# Patient Record
Sex: Female | Born: 1950 | Race: White | Hispanic: No | Smoking: Former smoker
Health system: Southern US, Community
[De-identification: ages and names within clinical notes are randomized; demographics above are authoritative.]

## PROBLEM LIST (undated history)

## (undated) DIAGNOSIS — M8580 Other specified disorders of bone density and structure, unspecified site: Secondary | ICD-10-CM

## (undated) DIAGNOSIS — F411 Generalized anxiety disorder: Secondary | ICD-10-CM

## (undated) DIAGNOSIS — K589 Irritable bowel syndrome without diarrhea: Secondary | ICD-10-CM

## (undated) DIAGNOSIS — K5790 Diverticulosis of intestine, part unspecified, without perforation or abscess without bleeding: Secondary | ICD-10-CM

## (undated) DIAGNOSIS — F32A Depression, unspecified: Secondary | ICD-10-CM

## (undated) DIAGNOSIS — F329 Major depressive disorder, single episode, unspecified: Secondary | ICD-10-CM

## (undated) DIAGNOSIS — M51369 Other intervertebral disc degeneration, lumbar region without mention of lumbar back pain or lower extremity pain: Secondary | ICD-10-CM

## (undated) DIAGNOSIS — I700 Atherosclerosis of aorta: Secondary | ICD-10-CM

## (undated) DIAGNOSIS — E559 Vitamin D deficiency, unspecified: Secondary | ICD-10-CM

## (undated) DIAGNOSIS — M5136 Other intervertebral disc degeneration, lumbar region: Secondary | ICD-10-CM

## (undated) DIAGNOSIS — E669 Obesity, unspecified: Secondary | ICD-10-CM

## (undated) DIAGNOSIS — M1712 Unilateral primary osteoarthritis, left knee: Secondary | ICD-10-CM

## (undated) DIAGNOSIS — M1990 Unspecified osteoarthritis, unspecified site: Secondary | ICD-10-CM

## (undated) DIAGNOSIS — N809 Endometriosis, unspecified: Secondary | ICD-10-CM

## (undated) DIAGNOSIS — M48061 Spinal stenosis, lumbar region without neurogenic claudication: Secondary | ICD-10-CM

## (undated) DIAGNOSIS — K219 Gastro-esophageal reflux disease without esophagitis: Secondary | ICD-10-CM

## (undated) HISTORY — DX: Gastro-esophageal reflux disease without esophagitis: K21.9

## (undated) HISTORY — DX: Other intervertebral disc degeneration, lumbar region without mention of lumbar back pain or lower extremity pain: M51.369

## (undated) HISTORY — DX: Irritable bowel syndrome, unspecified: K58.9

## (undated) HISTORY — PX: REDUCTION MAMMAPLASTY: SUR839

## (undated) HISTORY — DX: Other intervertebral disc degeneration, lumbar region: M51.36

## (undated) HISTORY — PX: BREAST REDUCTION SURGERY: SHX8

## (undated) HISTORY — PX: BUNIONECTOMY: SHX129

## (undated) HISTORY — PX: KNEE ARTHROSCOPY: SHX127

## (undated) HISTORY — DX: Endometriosis, unspecified: N80.9

## (undated) HISTORY — DX: Depression, unspecified: F32.A

## (undated) HISTORY — PX: ABDOMINAL HYSTERECTOMY: SHX81

## (undated) HISTORY — DX: Major depressive disorder, single episode, unspecified: F32.9

---
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
## 1978-09-14 HISTORY — PX: ABDOMINAL HYSTERECTOMY: SHX81

## 1987-09-15 HISTORY — PX: REDUCTION MAMMAPLASTY: SUR839

## 2010-07-13 ENCOUNTER — Emergency Department (HOSPITAL_COMMUNITY): Admission: EM | Admit: 2010-07-13 | Discharge: 2010-07-14 | Payer: Self-pay | Admitting: Emergency Medicine

## 2010-07-14 ENCOUNTER — Ambulatory Visit: Payer: Self-pay | Admitting: Psychiatry

## 2010-07-14 ENCOUNTER — Inpatient Hospital Stay (HOSPITAL_COMMUNITY): Admission: AD | Admit: 2010-07-14 | Discharge: 2010-07-18 | Payer: Self-pay | Admitting: Psychiatry

## 2010-07-22 ENCOUNTER — Other Ambulatory Visit (HOSPITAL_COMMUNITY)
Admission: RE | Admit: 2010-07-22 | Discharge: 2010-08-13 | Payer: Self-pay | Source: Home / Self Care | Admitting: Psychiatry

## 2010-08-15 ENCOUNTER — Ambulatory Visit: Payer: Self-pay | Admitting: Psychiatry

## 2010-09-02 ENCOUNTER — Ambulatory Visit (HOSPITAL_COMMUNITY): Payer: Self-pay | Admitting: Psychiatry

## 2010-09-09 ENCOUNTER — Encounter
Admission: RE | Admit: 2010-09-09 | Discharge: 2010-09-09 | Payer: Self-pay | Source: Home / Self Care | Attending: Family Medicine | Admitting: Family Medicine

## 2010-09-24 ENCOUNTER — Ambulatory Visit (HOSPITAL_COMMUNITY)
Admission: RE | Admit: 2010-09-24 | Discharge: 2010-09-24 | Payer: Self-pay | Source: Home / Self Care | Attending: Psychiatry | Admitting: Psychiatry

## 2010-10-02 ENCOUNTER — Ambulatory Visit (HOSPITAL_COMMUNITY)
Admission: RE | Admit: 2010-10-02 | Discharge: 2010-10-02 | Payer: Self-pay | Source: Home / Self Care | Attending: Psychology | Admitting: Psychology

## 2010-10-16 ENCOUNTER — Ambulatory Visit (HOSPITAL_COMMUNITY): Admit: 2010-10-16 | Payer: Self-pay | Admitting: Psychology

## 2010-10-16 ENCOUNTER — Encounter (HOSPITAL_COMMUNITY): Payer: Self-pay | Admitting: Psychology

## 2010-10-17 ENCOUNTER — Encounter (HOSPITAL_COMMUNITY): Payer: BC Managed Care – PPO | Admitting: Psychiatry

## 2010-10-17 ENCOUNTER — Ambulatory Visit (HOSPITAL_COMMUNITY): Admit: 2010-10-17 | Payer: Self-pay | Admitting: Psychiatry

## 2010-10-17 DIAGNOSIS — F331 Major depressive disorder, recurrent, moderate: Secondary | ICD-10-CM

## 2010-10-20 ENCOUNTER — Encounter (HOSPITAL_COMMUNITY): Payer: BC Managed Care – PPO | Admitting: Psychology

## 2010-10-20 DIAGNOSIS — F331 Major depressive disorder, recurrent, moderate: Secondary | ICD-10-CM

## 2010-10-20 DIAGNOSIS — F411 Generalized anxiety disorder: Secondary | ICD-10-CM

## 2010-11-06 ENCOUNTER — Encounter (HOSPITAL_COMMUNITY): Payer: BC Managed Care – PPO | Admitting: Psychology

## 2010-11-06 ENCOUNTER — Ambulatory Visit (HOSPITAL_COMMUNITY)
Admission: RE | Admit: 2010-11-06 | Discharge: 2010-11-06 | Disposition: A | Discharge status: Home / Self Care | Payer: BC Managed Care – PPO | Source: Ambulatory Visit | Attending: Psychiatry | Admitting: Psychiatry

## 2010-11-06 DIAGNOSIS — F331 Major depressive disorder, recurrent, moderate: Secondary | ICD-10-CM

## 2010-11-06 DIAGNOSIS — F332 Major depressive disorder, recurrent severe without psychotic features: Secondary | ICD-10-CM | POA: Insufficient documentation

## 2010-11-06 DIAGNOSIS — F411 Generalized anxiety disorder: Secondary | ICD-10-CM

## 2010-11-07 ENCOUNTER — Other Ambulatory Visit (HOSPITAL_COMMUNITY): Payer: BC Managed Care – PPO | Attending: Psychiatry | Admitting: Psychiatry

## 2010-11-07 DIAGNOSIS — F329 Major depressive disorder, single episode, unspecified: Secondary | ICD-10-CM | POA: Insufficient documentation

## 2010-11-07 DIAGNOSIS — F3289 Other specified depressive episodes: Secondary | ICD-10-CM | POA: Insufficient documentation

## 2010-11-07 DIAGNOSIS — F411 Generalized anxiety disorder: Secondary | ICD-10-CM | POA: Insufficient documentation

## 2010-11-07 DIAGNOSIS — F331 Major depressive disorder, recurrent, moderate: Secondary | ICD-10-CM

## 2010-11-07 DIAGNOSIS — M1990 Unspecified osteoarthritis, unspecified site: Secondary | ICD-10-CM | POA: Insufficient documentation

## 2010-11-10 ENCOUNTER — Other Ambulatory Visit (HOSPITAL_COMMUNITY): Payer: BC Managed Care – PPO | Admitting: Psychiatry

## 2010-11-11 ENCOUNTER — Other Ambulatory Visit (HOSPITAL_COMMUNITY): Payer: BC Managed Care – PPO | Admitting: Psychiatry

## 2010-11-12 ENCOUNTER — Other Ambulatory Visit (HOSPITAL_COMMUNITY): Payer: BC Managed Care – PPO | Admitting: Psychiatry

## 2010-11-13 ENCOUNTER — Other Ambulatory Visit (HOSPITAL_COMMUNITY): Payer: BC Managed Care – PPO | Attending: Psychiatry | Admitting: Psychiatry

## 2010-11-14 ENCOUNTER — Other Ambulatory Visit (HOSPITAL_COMMUNITY): Payer: BC Managed Care – PPO | Admitting: Psychiatry

## 2010-11-17 ENCOUNTER — Other Ambulatory Visit (HOSPITAL_COMMUNITY): Payer: BC Managed Care – PPO | Admitting: Psychiatry

## 2010-11-18 ENCOUNTER — Other Ambulatory Visit (HOSPITAL_COMMUNITY): Payer: BC Managed Care – PPO | Admitting: Psychiatry

## 2010-11-19 ENCOUNTER — Other Ambulatory Visit (HOSPITAL_COMMUNITY): Payer: BC Managed Care – PPO | Admitting: Psychiatry

## 2010-11-20 ENCOUNTER — Other Ambulatory Visit (HOSPITAL_COMMUNITY): Payer: BC Managed Care – PPO | Admitting: Psychiatry

## 2010-11-21 ENCOUNTER — Other Ambulatory Visit (HOSPITAL_COMMUNITY): Payer: BC Managed Care – PPO | Admitting: Psychiatry

## 2010-11-24 ENCOUNTER — Encounter (HOSPITAL_COMMUNITY): Payer: BC Managed Care – PPO | Admitting: Psychiatry

## 2010-11-24 DIAGNOSIS — F331 Major depressive disorder, recurrent, moderate: Secondary | ICD-10-CM

## 2010-11-25 ENCOUNTER — Other Ambulatory Visit (HOSPITAL_COMMUNITY): Payer: BC Managed Care – PPO | Admitting: Psychiatry

## 2010-11-25 ENCOUNTER — Emergency Department (HOSPITAL_COMMUNITY)
Admission: EM | Admit: 2010-11-25 | Discharge: 2010-11-26 | Disposition: A | Discharge status: Other Acute Inpatient Hospital | Payer: BC Managed Care – PPO | Attending: Emergency Medicine | Admitting: Emergency Medicine

## 2010-11-25 DIAGNOSIS — R45851 Suicidal ideations: Secondary | ICD-10-CM | POA: Insufficient documentation

## 2010-11-25 DIAGNOSIS — Z79899 Other long term (current) drug therapy: Secondary | ICD-10-CM | POA: Insufficient documentation

## 2010-11-25 DIAGNOSIS — F3289 Other specified depressive episodes: Secondary | ICD-10-CM | POA: Insufficient documentation

## 2010-11-25 DIAGNOSIS — T50901A Poisoning by unspecified drugs, medicaments and biological substances, accidental (unintentional), initial encounter: Secondary | ICD-10-CM | POA: Insufficient documentation

## 2010-11-25 DIAGNOSIS — F329 Major depressive disorder, single episode, unspecified: Secondary | ICD-10-CM | POA: Insufficient documentation

## 2010-11-25 DIAGNOSIS — T50902A Poisoning by unspecified drugs, medicaments and biological substances, intentional self-harm, initial encounter: Secondary | ICD-10-CM | POA: Insufficient documentation

## 2010-11-25 LAB — CBC
HCT: 39.7 % (ref 36.0–46.0)
Platelets: 173 10*3/uL (ref 150–400)
RDW: 12.9 % (ref 11.5–15.5)
WBC: 7.7 10*3/uL (ref 4.0–10.5)

## 2010-11-25 LAB — URINALYSIS, ROUTINE W REFLEX MICROSCOPIC
Bilirubin Urine: NEGATIVE
Hgb urine dipstick: NEGATIVE
Protein, ur: NEGATIVE mg/dL
Specific Gravity, Urine: 1.007 (ref 1.005–1.030)
pH: 6 (ref 5.0–8.0)

## 2010-11-25 LAB — DIFFERENTIAL
Basophils Absolute: 0 10*3/uL (ref 0.0–0.1)
Basophils Relative: 1 % (ref 0–1)
Eosinophils Absolute: 0.2 10*3/uL (ref 0.0–0.7)
Eosinophils Relative: 2 % (ref 0–5)
Lymphocytes Relative: 31 % (ref 12–46)
Lymphs Abs: 2.4 10*3/uL (ref 0.7–4.0)
Monocytes Absolute: 0.6 10*3/uL (ref 0.1–1.0)
Monocytes Relative: 8 % (ref 3–12)
Neutrophils Relative %: 58 % (ref 43–77)

## 2010-11-25 LAB — COMPREHENSIVE METABOLIC PANEL
ALT: 13 U/L (ref 0–35)
Albumin: 4 g/dL (ref 3.5–5.2)
Alkaline Phosphatase: 35 U/L — ABNORMAL LOW (ref 39–117)
Chloride: 106 mEq/L (ref 96–112)
Creatinine, Ser: 0.77 mg/dL (ref 0.4–1.2)
Potassium: 3.9 mEq/L (ref 3.5–5.1)
Sodium: 139 mEq/L (ref 135–145)

## 2010-11-25 LAB — RAPID URINE DRUG SCREEN, HOSP PERFORMED
Amphetamines: NOT DETECTED
Barbiturates: NOT DETECTED
Benzodiazepines: NOT DETECTED
Tetrahydrocannabinol: NOT DETECTED

## 2010-11-25 LAB — ACETAMINOPHEN LEVEL: Acetaminophen (Tylenol), Serum: <10 ug/mL — ABNORMAL LOW (ref 10–30)

## 2010-11-25 LAB — PROTIME-INR: INR: 0.99 (ref 0.00–1.49)

## 2010-11-25 LAB — SALICYLATE LEVEL: Salicylate Lvl: <4 mg/dL (ref 2.8–20.0)

## 2010-11-25 LAB — ETHANOL: Alcohol, Ethyl (B): <5 mg/dL (ref 0–10)

## 2010-11-26 ENCOUNTER — Other Ambulatory Visit (HOSPITAL_COMMUNITY): Payer: BC Managed Care – PPO | Admitting: Psychiatry

## 2010-11-26 ENCOUNTER — Inpatient Hospital Stay (HOSPITAL_COMMUNITY)
Admission: RE | Admit: 2010-11-26 | Discharge: 2010-11-30 | DRG: 430 | Disposition: A | Discharge status: Home / Self Care | Payer: BC Managed Care – PPO | Source: Ambulatory Visit | Attending: Psychiatry | Admitting: Psychiatry

## 2010-11-26 DIAGNOSIS — M1990 Unspecified osteoarthritis, unspecified site: Secondary | ICD-10-CM

## 2010-11-26 DIAGNOSIS — T50902A Poisoning by unspecified drugs, medicaments and biological substances, intentional self-harm, initial encounter: Secondary | ICD-10-CM

## 2010-11-26 DIAGNOSIS — F411 Generalized anxiety disorder: Secondary | ICD-10-CM

## 2010-11-26 DIAGNOSIS — F332 Major depressive disorder, recurrent severe without psychotic features: Principal | ICD-10-CM

## 2010-11-26 DIAGNOSIS — T50901A Poisoning by unspecified drugs, medicaments and biological substances, accidental (unintentional), initial encounter: Secondary | ICD-10-CM

## 2010-11-26 DIAGNOSIS — K219 Gastro-esophageal reflux disease without esophagitis: Secondary | ICD-10-CM

## 2010-11-26 DIAGNOSIS — E559 Vitamin D deficiency, unspecified: Secondary | ICD-10-CM

## 2010-11-26 DIAGNOSIS — F331 Major depressive disorder, recurrent, moderate: Secondary | ICD-10-CM

## 2010-11-26 LAB — URINE CULTURE: Culture  Setup Time: 201203140111

## 2010-11-26 LAB — BASIC METABOLIC PANEL
BUN: 10 mg/dL (ref 6–23)
CO2: 26 mEq/L (ref 19–32)
GFR calc non Af Amer: >60 mL/min (ref >60)
Potassium: 3.5 mEq/L (ref 3.5–5.1)
Sodium: 140 mEq/L (ref 135–145)

## 2010-11-26 LAB — RAPID URINE DRUG SCREEN, HOSP PERFORMED
Amphetamines: NOT DETECTED
Barbiturates: NOT DETECTED
Opiates: NOT DETECTED
Tetrahydrocannabinol: NOT DETECTED

## 2010-11-26 LAB — DIFFERENTIAL
Basophils Absolute: 0 10*3/uL (ref 0.0–0.1)
Eosinophils Relative: 2 % (ref 0–5)
Monocytes Relative: 10 % (ref 3–12)
Neutro Abs: 3.5 10*3/uL (ref 1.7–7.7)

## 2010-11-26 LAB — CBC
HCT: 41.5 % (ref 36.0–46.0)
MCH: 30.1 pg (ref 26.0–34.0)
MCHC: 34.4 g/dL (ref 30.0–36.0)
MCV: 87.5 fL (ref 78.0–100.0)
WBC: 6.5 10*3/uL (ref 4.0–10.5)

## 2010-11-26 LAB — URINALYSIS, ROUTINE W REFLEX MICROSCOPIC
Ketones, ur: 15 mg/dL — AB
Urobilinogen, UA: 0.2 mg/dL (ref 0.0–1.0)
pH: 6 (ref 5.0–8.0)

## 2010-11-26 LAB — SALICYLATE LEVEL: Salicylate Lvl: <4 mg/dL (ref 2.8–20.0)

## 2010-11-26 LAB — ETHANOL: Alcohol, Ethyl (B): 82 mg/dL — ABNORMAL HIGH (ref 0–10)

## 2010-11-27 ENCOUNTER — Other Ambulatory Visit (HOSPITAL_COMMUNITY): Payer: BC Managed Care – PPO | Admitting: Psychiatry

## 2010-11-27 DIAGNOSIS — F339 Major depressive disorder, recurrent, unspecified: Secondary | ICD-10-CM
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
## 2010-12-02 ENCOUNTER — Encounter (HOSPITAL_COMMUNITY): Payer: BC Managed Care – PPO | Admitting: Psychology

## 2010-12-02 DIAGNOSIS — F331 Major depressive disorder, recurrent, moderate: Secondary | ICD-10-CM

## 2010-12-03 ENCOUNTER — Encounter (HOSPITAL_COMMUNITY): Payer: BC Managed Care – PPO | Admitting: Psychiatry

## 2010-12-03 DIAGNOSIS — F331 Major depressive disorder, recurrent, moderate: Secondary | ICD-10-CM

## 2010-12-09 ENCOUNTER — Encounter (HOSPITAL_COMMUNITY): Payer: BC Managed Care – PPO | Admitting: Psychology

## 2010-12-09 DIAGNOSIS — F331 Major depressive disorder, recurrent, moderate: Secondary | ICD-10-CM

## 2010-12-09 DIAGNOSIS — F411 Generalized anxiety disorder: Secondary | ICD-10-CM

## 2010-12-15 ENCOUNTER — Encounter (HOSPITAL_COMMUNITY): Payer: BC Managed Care – PPO | Admitting: Psychiatry

## 2010-12-17 ENCOUNTER — Encounter (HOSPITAL_COMMUNITY): Payer: BC Managed Care – PPO | Admitting: Psychiatry

## 2010-12-17 DIAGNOSIS — F331 Major depressive disorder, recurrent, moderate: Secondary | ICD-10-CM

## 2010-12-18 ENCOUNTER — Encounter (HOSPITAL_BASED_OUTPATIENT_CLINIC_OR_DEPARTMENT_OTHER): Payer: BC Managed Care – PPO | Admitting: Psychology

## 2010-12-18 DIAGNOSIS — F411 Generalized anxiety disorder: Secondary | ICD-10-CM

## 2010-12-18 DIAGNOSIS — F331 Major depressive disorder, recurrent, moderate: Secondary | ICD-10-CM

## 2010-12-21 NOTE — Discharge Summary (Signed)
NAME:  Emily, Singleton          ACCOUNT NO.:  [id number]  MEDICAL RECORD NO.:  [id number]           PATIENT TYPE:  I  LOCATION:  0501                          FACILITY:  [facility]  PHYSICIAN:  Marlis Edelson, DO        DATE OF BIRTH:  [birth year]  DATE OF ADMISSION:  11/26/2010 DATE OF DISCHARGE:  11/30/2010                              DISCHARGE SUMMARY   CHIEF COMPLAINT:  Overdose.  HISTORY OF CHIEF COMPLAINT:  Emily Singleton is a 60 year old Caucasian female, who was seen at the [facility] emergency department because of overdose.  She presented to the emergency department with history being provided by herself and EMS.  Per the records, the patient was seen around 5:50 p.m. on November 26, 2010.  She had supposedly taking some sleeping pills and some pills that the doctor gave her prior to her arrival to the apartment.  She was refusing to state at that time what pills she had took or how many.  She stated at the emergency department she wanted to sleep and not feel pain anymore. She had been known to this provider from a previous hospital stay in an intensive outpatient stay.  She suffers from major depressive disorder and was having problems with relationship issues.  He patient is followed in the outpatient clinic.  She has an anxiety disorder, is widowed and has had difficulty with relationships since her husband's death.  She has been treated for major depressive disorder, chronic, recurrent with previous hospitalizations to [facility].  HOSPITAL COURSE:  Ms. Rule was placed on the adult unit, where she was integrated into the unit activities, as well as group therapy.  She tolerated treatment well, had no behavioral discord, no suicidal or homicidal acts, no hypomania or mania.  She has no history and displayed no symptoms of psychosis.  During her hospitalization, insight increased.  She had a resolution of suicidal or homicidal thought.   She denied initially this being a serious suicidal attempt and stated that she simply wanted to fall asleep.  During the course of hospitalization, she came to the conclusion that the man with whom she is currently seeing that their relationship was not helpful and she needs to terminate that because of the stressors it imposes on her.  She was active and engaging in groups.  She made positive progress during her course of treatment with increased insight and plans.  She also received positive family support from her siblings.  She was placed on Protonix 40 mg daily for GERD, trazodone 50 mg p.o. nightly for sleep.  She tolerated all medications well with improvement in mood and affect.  She was discharged on November 30, 2010.  PROCEDURE:  None.  COMPLICATIONS:  None.  IMAGING:  None.  LABORATORY:  None.  MENTAL STATUS EXAM:  This examiner was not present at the time of discharge, but it reported that the patient displayed no suicidal or homicidal thought, intent or plan, no thought, intent or plan of harming self or others.  No evidence of psychoses.  DISCHARGE DIAGNOSIS:  As outlined in the above narrative.  DISCHARGE INSTRUCTIONS:  The  patient is to follow up with Forde Radon, her therapist on Tuesday, December 02, 2010.  She is to follow up with Dr. Lolly Mustache, her attending outpatient psychiatrist on Wednesday, December 03, 2010, at 1:15 p.m.  DISCHARGE MEDICATIONS: 1. Protonix 40 mg daily. 2. Desyrel 50 mg nightly for sleep. 3. Advil 2 tablets by mouth at bedtime p.r.n. 4. Artificial Tears 2 drops in both eyes as needed. 5. BuSpar 5 mg twice daily. 6. Estradiol 1 mg daily. 7. Excedrin PM 1 tablet by mouth daily p.r.n. 8. Mobic 15 mg by mouth daily. 9. Prozac 40 mg by mouth daily. 10.Sinus allergy OTC 1 tablet daily. 11.Vitamin D 50,000 units q. Sunday.  INSTRUCTIONS:  She is to return to the hospital for any development of suicidal or homicidal thought, intent or plan, seek  emergent treatment for any adverse reactions to medications.  Prognosis is fair with appropriate follow-up, medication compliance and therapy.          ______________________________ Marlis Edelson, DO     DB/MEDQ  D:  12/18/2010  T:  12/19/2010  Job:  [id number]  Electronically Signed by Marlis Edelson MD on 12/21/2010 09:37:48 PM

## 2011-01-01 ENCOUNTER — Encounter (HOSPITAL_COMMUNITY): Payer: BC Managed Care – PPO | Admitting: Psychology

## 2011-01-08 ENCOUNTER — Encounter (HOSPITAL_BASED_OUTPATIENT_CLINIC_OR_DEPARTMENT_OTHER): Payer: BC Managed Care – PPO | Admitting: Psychology

## 2011-01-08 DIAGNOSIS — F331 Major depressive disorder, recurrent, moderate: Secondary | ICD-10-CM

## 2011-01-15 ENCOUNTER — Encounter (HOSPITAL_COMMUNITY): Payer: BC Managed Care – PPO | Admitting: Psychology

## 2011-01-26 ENCOUNTER — Encounter (HOSPITAL_COMMUNITY): Payer: BC Managed Care – PPO | Admitting: Psychiatry

## 2011-02-05 ENCOUNTER — Encounter (HOSPITAL_BASED_OUTPATIENT_CLINIC_OR_DEPARTMENT_OTHER): Payer: BC Managed Care – PPO | Admitting: Psychology

## 2011-02-05 DIAGNOSIS — F331 Major depressive disorder, recurrent, moderate: Secondary | ICD-10-CM

## 2011-02-05 DIAGNOSIS — F411 Generalized anxiety disorder: Secondary | ICD-10-CM

## 2011-02-19 ENCOUNTER — Encounter (HOSPITAL_COMMUNITY): Payer: BC Managed Care – PPO | Admitting: Psychology

## 2012-04-04 ENCOUNTER — Other Ambulatory Visit: Payer: Self-pay | Admitting: Family Medicine

## 2012-04-04 ENCOUNTER — Ambulatory Visit
Admission: RE | Admit: 2012-04-04 | Discharge: 2012-04-04 | Disposition: A | Discharge status: Home / Self Care | Payer: PRIVATE HEALTH INSURANCE | Source: Ambulatory Visit | Attending: Family Medicine | Admitting: Family Medicine

## 2012-04-04 DIAGNOSIS — M545 Low back pain, unspecified: Secondary | ICD-10-CM

## 2012-04-12 ENCOUNTER — Other Ambulatory Visit: Payer: Self-pay | Admitting: Family Medicine

## 2012-04-12 DIAGNOSIS — Z1231 Encounter for screening mammogram for malignant neoplasm of breast: Secondary | ICD-10-CM

## 2012-04-19 ENCOUNTER — Ambulatory Visit: Payer: PRIVATE HEALTH INSURANCE | Attending: Family Medicine | Admitting: Rehabilitation

## 2012-04-19 DIAGNOSIS — M25559 Pain in unspecified hip: Secondary | ICD-10-CM | POA: Insufficient documentation

## 2012-04-19 DIAGNOSIS — M6281 Muscle weakness (generalized): Secondary | ICD-10-CM | POA: Insufficient documentation

## 2012-04-19 DIAGNOSIS — M25659 Stiffness of unspecified hip, not elsewhere classified: Secondary | ICD-10-CM | POA: Insufficient documentation

## 2012-04-19 DIAGNOSIS — R1032 Left lower quadrant pain: Secondary | ICD-10-CM | POA: Insufficient documentation

## 2012-04-19 DIAGNOSIS — IMO0001 Reserved for inherently not codable concepts without codable children: Secondary | ICD-10-CM | POA: Insufficient documentation

## 2012-04-21 ENCOUNTER — Ambulatory Visit: Payer: PRIVATE HEALTH INSURANCE | Admitting: Rehabilitation

## 2012-04-25 ENCOUNTER — Ambulatory Visit
Admission: RE | Admit: 2012-04-25 | Discharge: 2012-04-25 | Disposition: A | Discharge status: Home / Self Care | Payer: PRIVATE HEALTH INSURANCE | Source: Ambulatory Visit | Attending: Family Medicine | Admitting: Family Medicine

## 2012-04-25 ENCOUNTER — Ambulatory Visit: Payer: PRIVATE HEALTH INSURANCE | Admitting: Rehabilitation

## 2012-04-25 DIAGNOSIS — Z1231 Encounter for screening mammogram for malignant neoplasm of breast: Secondary | ICD-10-CM

## 2012-04-29 ENCOUNTER — Encounter: Payer: PRIVATE HEALTH INSURANCE | Admitting: Rehabilitation

## 2012-05-03 ENCOUNTER — Encounter: Payer: PRIVATE HEALTH INSURANCE | Admitting: Rehabilitation

## 2012-05-05 ENCOUNTER — Encounter: Payer: PRIVATE HEALTH INSURANCE | Admitting: Rehabilitation

## 2013-03-24 ENCOUNTER — Other Ambulatory Visit: Payer: No Typology Code available for payment source

## 2013-03-24 DIAGNOSIS — E785 Hyperlipidemia, unspecified: Secondary | ICD-10-CM

## 2013-03-24 DIAGNOSIS — Z0000 Encounter for general adult medical examination without abnormal findings: Secondary | ICD-10-CM

## 2013-03-24 DIAGNOSIS — Z79899 Other long term (current) drug therapy: Secondary | ICD-10-CM

## 2013-03-24 DIAGNOSIS — E559 Vitamin D deficiency, unspecified: Secondary | ICD-10-CM

## 2013-03-24 LAB — CBC WITH DIFFERENTIAL/PLATELET
Basophils Absolute: 0.1 10*3/uL (ref 0.0–0.1)
HCT: 37.7 % (ref 36.0–46.0)
Lymphocytes Relative: 33 % (ref 12–46)
Neutro Abs: 2.8 10*3/uL (ref 1.7–7.7)
Neutrophils Relative %: 52 % (ref 43–77)
Platelets: 195 10*3/uL (ref 150–400)
RDW: 14 % (ref 11.5–15.5)
WBC: 5.4 10*3/uL (ref 4.0–10.5)

## 2013-03-24 LAB — LIPID PANEL
HDL: 64 mg/dL (ref >39)
LDL Cholesterol: 94 mg/dL (ref 0–99)
Total CHOL/HDL Ratio: 2.6 Ratio

## 2013-03-24 LAB — TSH: TSH: 3.189 u[IU]/mL (ref 0.350–4.500)

## 2013-03-25 LAB — VITAMIN D 25 HYDROXY (VIT D DEFICIENCY, FRACTURES): Vit D, 25-Hydroxy: 45 ng/mL (ref 30–89)

## 2013-04-06 ENCOUNTER — Ambulatory Visit (INDEPENDENT_AMBULATORY_CARE_PROVIDER_SITE_OTHER): Payer: No Typology Code available for payment source | Admitting: Family Medicine

## 2013-04-06 ENCOUNTER — Encounter: Payer: Self-pay | Admitting: Family Medicine

## 2013-04-06 VITALS — BP 100/60 | HR 78 | Temp 98.3°F | Resp 18 | Ht 66.0 in | Wt 214.0 lb

## 2013-04-06 DIAGNOSIS — Z1231 Encounter for screening mammogram for malignant neoplasm of breast: Secondary | ICD-10-CM

## 2013-04-06 DIAGNOSIS — M5136 Other intervertebral disc degeneration, lumbar region: Secondary | ICD-10-CM | POA: Insufficient documentation

## 2013-04-06 DIAGNOSIS — Z0000 Encounter for general adult medical examination without abnormal findings: Secondary | ICD-10-CM

## 2013-04-06 DIAGNOSIS — K589 Irritable bowel syndrome without diarrhea: Secondary | ICD-10-CM | POA: Insufficient documentation

## 2013-04-06 DIAGNOSIS — Z1239 Encounter for other screening for malignant neoplasm of breast: Secondary | ICD-10-CM

## 2013-04-06 MED ORDER — MELOXICAM 15 MG PO TABS: 15.0000 mg | ORAL_TABLET | Freq: Every day | ORAL | Status: DC

## 2013-04-06 MED ORDER — HYOSCYAMINE SULFATE 0.125 MG SL SUBL: 0.1250 mg | SUBLINGUAL_TABLET | Freq: Four times a day (QID) | SUBLINGUAL | Status: DC | PRN

## 2013-04-06 NOTE — Addendum Note (Signed)
Addended by: Lynnea Ferrier on: 04/06/2013 09:56 AM   Modules accepted: Orders

## 2013-04-06 NOTE — Progress Notes (Signed)
Subjective:    Patient ID: Emily Singleton, female    DOB: 1951-05-03, 62 y.o.   MRN: [id number]  HPI Patient is here today for a complete physical exam. She continues to have her chronic low back pain. X-rays of her thoracic spine obtained in July 2013 revealed moderate multilevel degenerative disc changes. X-rays of the lumbar spine obtained in July 2013 revealed moderate multilevel degenerative changes with lumbar levoscoliosis.  She is currently taking Mobic 15 mg by mouth daily with some relief. She also complains of fatigue. She denies chest pain shortness of breath or dyspnea on exertion.  Her last colonoscopy was in 2007, her last bone density test was in 2011. She did not require Pap smears due to her history of a hysterectomy. She had the shingles shot in 2012. She is overdue for a tetanus shot. Past Medical History  Diagnosis Date  . Endometriosis   . Depression   . DDD (degenerative disc disease), lumbar   . IBS (irritable bowel syndrome)    Past Surgical History  Procedure Laterality Date  . Abdominal hysterectomy      partial  . Breast reduction surgery     Current outpatient prescriptions:hyoscyamine (LEVSIN SL) 0.125 MG SL tablet, Place 1 tablet (0.125 mg total) under the tongue every 6 (six) hours as needed for cramping., Disp: 120 tablet, Rfl: 3;  meloxicam (MOBIC) 15 MG tablet, Take 1 tablet (15 mg total) by mouth daily., Disp: 90 tablet, Rfl: 3  No Known Allergies  History   Social History  . Marital Status: Married    Spouse Name: N/A    Number of Children: N/A  . Years of Education: N/A   Occupational History  . Not on file.   Social History Main Topics  . Smoking status: Former Games developer  . Smokeless tobacco: Never Used  . Alcohol Use: Yes     Comment: occasional  . Drug Use: No  . Sexually Active: Yes     Comment: widow, remarried to Faythe Ghee   Other Topics Concern  . Not on file   Social History Narrative  . No narrative on file   There is  no family history of coronary artery disease, breast cancer, colon cancer, or ovarian cancer.  Review of Systems  All other systems reviewed and are negative.       Objective:   Physical Exam  Vitals reviewed. Constitutional: She is oriented to person, place, and time. She appears well-developed and well-nourished. No distress.  HENT:  Head: Normocephalic and atraumatic.  Right Ear: External ear normal.  Left Ear: External ear normal.  Nose: Nose normal.  Mouth/Throat: Oropharynx is clear and moist. No oropharyngeal exudate.  Eyes: Conjunctivae and EOM are normal. Pupils are equal, round, and reactive to light. Right eye exhibits no discharge. Left eye exhibits no discharge. No scleral icterus.  Neck: Normal range of motion. Neck supple. No JVD present. No tracheal deviation present. No thyromegaly present.  Cardiovascular: Normal rate, regular rhythm, normal heart sounds and intact distal pulses.  Exam reveals no gallop and no friction rub.   No murmur heard. Pulmonary/Chest: Effort normal and breath sounds normal. No stridor. No respiratory distress. She has no wheezes. She has no rales. She exhibits no tenderness.  Abdominal: Soft. Bowel sounds are normal. She exhibits no distension and no mass. There is no tenderness. There is no rebound and no guarding.  Musculoskeletal: She exhibits tenderness. She exhibits no edema.       Lumbar back: She exhibits  decreased range of motion, tenderness and bony tenderness.  Lymphadenopathy:    She has no cervical adenopathy.  Neurological: She is alert and oriented to person, place, and time. She displays normal reflexes. No cranial nerve deficit. She exhibits normal muscle tone. Coordination normal.  Skin: Skin is warm. No rash noted. She is not diaphoretic. No erythema. No pallor.  Psychiatric: She has a normal mood and affect. Her behavior is normal. Judgment and thought content normal.   both breasts show postsurgical changes. There are no  masses or lymphadenopathy in the axilla.  Lab on 03/24/2013  Component Date Value Range Status  . Cholesterol 03/24/2013 168  0 - 200 mg/dL Final   Comment: ATP III Classification:                                < 200        mg/dL        Desirable                               200 - 239     mg/dL        Borderline High                               >= 240        mg/dL        High                             . Triglycerides 03/24/2013 49  <150 mg/dL Final  . HDL 16/06/9603 64  >39 mg/dL Final  . Total CHOL/HDL Ratio 03/24/2013 2.6   Final  . VLDL 03/24/2013 10  0 - 40 mg/dL Final  . LDL Cholesterol 03/24/2013 94  0 - 99 mg/dL Final   Comment:                            Total Cholesterol/HDL Ratio:CHD Risk                                                 Coronary Heart Disease Risk Table                                                                 Men       Women                                   1/2 Average Risk              3.4        3.3  Average Risk              5.0        4.4                                    2X Average Risk              9.6        7.1                                    3X Average Risk             23.4       11.0                          Use the calculated Patient Ratio above and the CHD Risk table                           to determine the patient's CHD Risk.                          ATP III Classification (LDL):                                < 100        mg/dL         Optimal                               100 - 129     mg/dL         Near or Above Optimal                               130 - 159     mg/dL         Borderline High                               160 - 189     mg/dL         High                                > 190        mg/dL         Very High                             . WBC 03/24/2013 5.4  4.0 - 10.5 K/uL Final  . RBC 03/24/2013 4.42  3.87 - 5.11 MIL/uL Final  . Hemoglobin 03/24/2013 12.5  12.0 - 15.0  g/dL Final  . HCT 16/06/9603 37.7  36.0 - 46.0 % Final  . MCV 03/24/2013 85.3  78.0 - 100.0 fL Final  . MCH 03/24/2013 28.3  26.0 - 34.0 pg Final  . MCHC 03/24/2013 33.2  30.0 - 36.0 g/dL Final  . RDW 54/05/8118 14.0  11.5 - 15.5 %  Final  . Platelets 03/24/2013 195  150 - 400 K/uL Final  . Neutrophils Relative % 03/24/2013 52  43 - 77 % Final  . Neutro Abs 03/24/2013 2.8  1.7 - 7.7 K/uL Final  . Lymphocytes Relative 03/24/2013 33  12 - 46 % Final  . Lymphs Abs 03/24/2013 1.8  0.7 - 4.0 K/uL Final  . Monocytes Relative 03/24/2013 8  3 - 12 % Final  . Monocytes Absolute 03/24/2013 0.4  0.1 - 1.0 K/uL Final  . Eosinophils Relative 03/24/2013 6* 0 - 5 % Final  . Eosinophils Absolute 03/24/2013 0.3  0.0 - 0.7 K/uL Final  . Basophils Relative 03/24/2013 1  0 - 1 % Final  . Basophils Absolute 03/24/2013 0.1  0.0 - 0.1 K/uL Final  . Smear Review 03/24/2013 Criteria for review not met   Final  . TSH 03/24/2013 3.189  0.350 - 4.500 uIU/mL Final  . Vit D, 25-Hydroxy 03/24/2013 45  30 - 89 ng/mL Final   Comment: This assay accurately quantifies Vitamin D, which is the sum of the                          25-Hydroxy forms of Vitamin D2 and D3.  Studies have shown that the                          optimum concentration of 25-Hydroxy Vitamin D is 30 ng/mL or higher.                           Concentrations of Vitamin D between 20 and 29 ng/mL are considered to                          be insufficient and concentrations less than 20 ng/mL are considered                          to be deficient for Vitamin D.         Assessment & Plan:  1. Routine general medical examination at a health care facility I reviewed the patient's labs with her. They're completely normal. Exam today is normal aside from obesity. I do not see any medical cause of her fatigue. I recommended increasing her aerobic exercise to 30 minutes a day 5 days a week to see if this would help. I did warn about possible GI side effects  from taking Mobic 15 mg by mouth daily. The patient is willing to accept the risk due to the severity of her low back pain. I recommended a tetanus shot. The patient will call her insurance and check on the cost prior to receiving. Otherwise her preventative care is up-to-date.

## 2013-05-09 ENCOUNTER — Ambulatory Visit: Payer: PRIVATE HEALTH INSURANCE

## 2013-05-17 ENCOUNTER — Ambulatory Visit
Admission: RE | Admit: 2013-05-17 | Discharge: 2013-05-17 | Disposition: A | Discharge status: Home / Self Care | Payer: No Typology Code available for payment source | Source: Ambulatory Visit | Attending: Family Medicine | Admitting: Family Medicine

## 2013-05-17 DIAGNOSIS — Z1231 Encounter for screening mammogram for malignant neoplasm of breast: Secondary | ICD-10-CM

## 2013-05-17 DIAGNOSIS — Z0000 Encounter for general adult medical examination without abnormal findings: Secondary | ICD-10-CM

## 2013-06-16 ENCOUNTER — Ambulatory Visit (INDEPENDENT_AMBULATORY_CARE_PROVIDER_SITE_OTHER): Payer: No Typology Code available for payment source | Admitting: Family Medicine

## 2013-06-16 DIAGNOSIS — Z23 Encounter for immunization: Secondary | ICD-10-CM

## 2014-04-04 ENCOUNTER — Other Ambulatory Visit: Payer: No Typology Code available for payment source

## 2014-04-04 DIAGNOSIS — Z0000 Encounter for general adult medical examination without abnormal findings: Secondary | ICD-10-CM

## 2014-04-04 LAB — COMPLETE METABOLIC PANEL WITH GFR
ALT: 9 U/L (ref 0–35)
AST: 17 U/L (ref 0–37)
Albumin: 4.1 g/dL (ref 3.5–5.2)
Alkaline Phosphatase: 45 U/L (ref 39–117)
BILIRUBIN TOTAL: 0.5 mg/dL (ref 0.2–1.2)
BUN: 14 mg/dL (ref 6–23)
CO2: 27 mEq/L (ref 19–32)
Calcium: 9.5 mg/dL (ref 8.4–10.5)
Chloride: 105 mEq/L (ref 96–112)
Creat: 0.72 mg/dL (ref 0.50–1.10)
GFR, EST NON AFRICAN AMERICAN: 89 mL/min
GFR, Est African American: >89 mL/min
GLUCOSE: 91 mg/dL (ref 70–99)
POTASSIUM: 5.2 meq/L (ref 3.5–5.3)
Sodium: 139 mEq/L (ref 135–145)
Total Protein: 6.9 g/dL (ref 6.0–8.3)

## 2014-04-04 LAB — CBC WITH DIFFERENTIAL/PLATELET
BASOS ABS: 0.1 10*3/uL (ref 0.0–0.1)
BASOS PCT: 2 % — AB (ref 0–1)
Eosinophils Absolute: 0.3 10*3/uL (ref 0.0–0.7)
Eosinophils Relative: 6 % — ABNORMAL HIGH (ref 0–5)
HEMATOCRIT: 36.2 % (ref 36.0–46.0)
Hemoglobin: 11.9 g/dL — ABNORMAL LOW (ref 12.0–15.0)
Lymphocytes Relative: 38 % (ref 12–46)
Lymphs Abs: 2 10*3/uL (ref 0.7–4.0)
MCH: 26.4 pg (ref 26.0–34.0)
MCHC: 32.9 g/dL (ref 30.0–36.0)
MCV: 80.4 fL (ref 78.0–100.0)
MONO ABS: 0.5 10*3/uL (ref 0.1–1.0)
MONOS PCT: 9 % (ref 3–12)
NEUTROS ABS: 2.4 10*3/uL (ref 1.7–7.7)
NEUTROS PCT: 45 % (ref 43–77)
Platelets: 279 10*3/uL (ref 150–400)
RBC: 4.5 MIL/uL (ref 3.87–5.11)
RDW: 14.4 % (ref 11.5–15.5)
WBC: 5.3 10*3/uL (ref 4.0–10.5)

## 2014-04-04 LAB — LIPID PANEL
CHOLESTEROL: 183 mg/dL (ref 0–200)
HDL: 68 mg/dL (ref >39)
LDL CALC: 102 mg/dL — AB (ref 0–99)
Total CHOL/HDL Ratio: 2.7 Ratio
Triglycerides: 63 mg/dL (ref <150)
VLDL: 13 mg/dL (ref 0–40)

## 2014-04-12 ENCOUNTER — Encounter: Payer: Self-pay | Admitting: Family Medicine

## 2014-04-12 ENCOUNTER — Ambulatory Visit (INDEPENDENT_AMBULATORY_CARE_PROVIDER_SITE_OTHER): Payer: No Typology Code available for payment source | Admitting: Family Medicine

## 2014-04-12 VITALS — BP 126/74 | HR 78 | Temp 98.1°F | Resp 18 | Ht 66.0 in | Wt 222.0 lb

## 2014-04-12 DIAGNOSIS — Z0000 Encounter for general adult medical examination without abnormal findings: Secondary | ICD-10-CM

## 2014-04-12 DIAGNOSIS — K2100 Gastro-esophageal reflux disease with esophagitis, without bleeding: Secondary | ICD-10-CM

## 2014-04-12 DIAGNOSIS — Z23 Encounter for immunization: Secondary | ICD-10-CM

## 2014-04-12 DIAGNOSIS — Z800 Family history of malignant neoplasm of digestive organs: Secondary | ICD-10-CM

## 2014-04-12 MED ORDER — PANTOPRAZOLE SODIUM 40 MG PO TBEC: 40.0000 mg | DELAYED_RELEASE_TABLET | Freq: Two times a day (BID) | ORAL | Status: DC

## 2014-04-12 NOTE — Addendum Note (Signed)
Addended by: Shary Decamp B on: 04/12/2014 09:46 AM   Modules accepted: Orders

## 2014-04-12 NOTE — Progress Notes (Signed)
Subjective:    Patient ID: Emily Singleton, female    DOB: 09/09/1951, 63 y.o.   MRN: [id number]  HPI Patient is here for complete physical exam.  Last year, the patient told me that ast colonoscopy was 2007.  Today, she states that she was mistaken. Her last colonoscopy was more than 11 years ago. She is overdue for colonoscopy. She is been having increasing acid reflux even though she is taking omeprazole 40 mg by mouth daily from her husband. She also describes food sticking in her esophagus. She reports constant daily epigastric discomfort and burning pain radiating up her sternum.  She denies any melena or hematochezia.  Last mammogram was 05/2013.  She does not require pap smears due to her history of hysterectomy.   Lab on 04/04/2014  Component Date Value Ref Range Status  . Cholesterol 04/04/2014 183  0 - 200 mg/dL Final   Comment: ATP III Classification:                                < 200        mg/dL        Desirable                               200 - 239     mg/dL        Borderline High                               >= 240        mg/dL        High                             . Triglycerides 04/04/2014 63  <150 mg/dL Final  . HDL 04/04/2014 68  >39 mg/dL Final  . Total CHOL/HDL Ratio 04/04/2014 2.7   Final  . VLDL 04/04/2014 13  0 - 40 mg/dL Final  . LDL Cholesterol 04/04/2014 102* 0 - 99 mg/dL Final   Comment:                            Total Cholesterol/HDL Ratio:CHD Risk                                                 Coronary Heart Disease Risk Table                                                                 Men       Women                                   1/2 Average Risk              3.4        3.3  Average Risk              5.0        4.4                                    2X Average Risk              9.6        7.1                                    3X Average Risk             23.4       11.0                          Use the  calculated Patient Ratio above and the CHD Risk table                           to determine the patient's CHD Risk.                          ATP III Classification (LDL):                                < 100        mg/dL         Optimal                               100 - 129     mg/dL         Near or Above Optimal                               130 - 159     mg/dL         Borderline High                               160 - 189     mg/dL         High                                > 190        mg/dL         Very High                             . WBC 04/04/2014 5.3  4.0 - 10.5 K/uL Final  . RBC 04/04/2014 4.50  3.87 - 5.11 MIL/uL Final  . Hemoglobin 04/04/2014 11.9* 12.0 - 15.0 g/dL Final  . HCT 04/04/2014 36.2  36.0 - 46.0 % Final  . MCV 04/04/2014 80.4  78.0 - 100.0 fL Final  . MCH 04/04/2014 26.4  26.0 - 34.0 pg Final  . MCHC 04/04/2014 32.9  30.0 - 36.0 g/dL Final  . RDW 04/04/2014 14.4  11.5 - 15.5 % Final  .  Platelets 04/04/2014 279  150 - 400 K/uL Final  . Neutrophils Relative % 04/04/2014 45  43 - 77 % Final  . Neutro Abs 04/04/2014 2.4  1.7 - 7.7 K/uL Final  . Lymphocytes Relative 04/04/2014 38  12 - 46 % Final  . Lymphs Abs 04/04/2014 2.0  0.7 - 4.0 K/uL Final  . Monocytes Relative 04/04/2014 9  3 - 12 % Final  . Monocytes Absolute 04/04/2014 0.5  0.1 - 1.0 K/uL Final  . Eosinophils Relative 04/04/2014 6* 0 - 5 % Final  . Eosinophils Absolute 04/04/2014 0.3  0.0 - 0.7 K/uL Final  . Basophils Relative 04/04/2014 2* 0 - 1 % Final  . Basophils Absolute 04/04/2014 0.1  0.0 - 0.1 K/uL Final  . Smear Review 04/04/2014 Criteria for review not met   Final  . Sodium 04/04/2014 139  135 - 145 mEq/L Final  . Potassium 04/04/2014 5.2  3.5 - 5.3 mEq/L Final  . Chloride 04/04/2014 105  96 - 112 mEq/L Final  . CO2 04/04/2014 27  19 - 32 mEq/L Final  . Glucose, Bld 04/04/2014 91  70 - 99 mg/dL Final  . BUN 04/04/2014 14  6 - 23 mg/dL Final  . Creat 04/04/2014 0.72  0.50 - 1.10 mg/dL  Final  . Total Bilirubin 04/04/2014 0.5  0.2 - 1.2 mg/dL Final  . Alkaline Phosphatase 04/04/2014 45  39 - 117 U/L Final  . AST 04/04/2014 17  0 - 37 U/L Final  . ALT 04/04/2014 9  0 - 35 U/L Final  . Total Protein 04/04/2014 6.9  6.0 - 8.3 g/dL Final  . Albumin 04/04/2014 4.1  3.5 - 5.2 g/dL Final  . Calcium 04/04/2014 9.5  8.4 - 10.5 mg/dL Final  . GFR, Est African American 04/04/2014 >89   Final  . GFR, Est Non African American 04/04/2014 89   Final   Comment:                            The estimated GFR is a calculation valid for adults (>=28 years old)                          that uses the CKD-EPI algorithm to adjust for age and sex. It is                            not to be used for children, pregnant women, hospitalized patients,                             patients on dialysis, or with rapidly changing kidney function.                          According to the NKDEP, eGFR >89 is normal, 60-89 shows mild                          impairment, 30-59 shows moderate impairment, 15-29 shows severe                          impairment and <15 is ESRD.  Past Medical History  Diagnosis Date  . Endometriosis   . Depression   . DDD (degenerative disc disease), lumbar   . IBS (irritable bowel syndrome)    Past Surgical History  Procedure Laterality Date  . Abdominal hysterectomy      partial  . Breast reduction surgery     Current Outpatient Prescriptions on File Prior to Visit  Medication Sig Dispense Refill  . hyoscyamine (LEVSIN SL) 0.125 MG SL tablet Place 1 tablet (0.125 mg total) under the tongue every 6 (six) hours as needed for cramping.  120 tablet  3  . meloxicam (MOBIC) 15 MG tablet Take 1 tablet (15 mg total) by mouth daily.  90 tablet  3   No current facility-administered medications on file prior to visit.   No Known Allergies History   Social History  . Marital Status: Married    Spouse Name: N/A    Number of Children: N/A  . Years  of Education: N/A   Occupational History  . Not on file.   Social History Main Topics  . Smoking status: Former Research scientist (life sciences)  . Smokeless tobacco: Never Used  . Alcohol Use: Yes     Comment: occasional  . Drug Use: No  . Sexual Activity: Yes     Comment: widow, remarried to Juliette Alcide   Other Topics Concern  . Not on file   Social History Narrative  . No narrative on file      Review of Systems  All other systems reviewed and are negative.      Objective:   Physical Exam  Vitals reviewed. Constitutional: She is oriented to person, place, and time. She appears well-developed and well-nourished. No distress.  HENT:  Head: Normocephalic and atraumatic.  Right Ear: External ear normal.  Left Ear: External ear normal.  Nose: Nose normal.  Mouth/Throat: Oropharynx is clear and moist.  Eyes: Conjunctivae and EOM are normal. Pupils are equal, round, and reactive to light. Right eye exhibits no discharge. Left eye exhibits no discharge. No scleral icterus.  Neck: Normal range of motion. Neck supple. No JVD present. No tracheal deviation present. No thyromegaly present.  Cardiovascular: Normal rate, regular rhythm, normal heart sounds and intact distal pulses.  Exam reveals no gallop and no friction rub.   No murmur heard. Pulmonary/Chest: Effort normal and breath sounds normal. No stridor. No respiratory distress. She has no wheezes. She has no rales. She exhibits no tenderness.  Abdominal: Soft. Bowel sounds are normal. She exhibits no distension and no mass. There is no tenderness. There is no rebound and no guarding.  Musculoskeletal: Normal range of motion. She exhibits no edema and no tenderness.  Lymphadenopathy:    She has no cervical adenopathy.  Neurological: She is alert and oriented to person, place, and time. She displays normal reflexes. No cranial nerve deficit. She exhibits normal muscle tone. Coordination normal.  Skin: Skin is warm. No rash noted. She is not  diaphoretic. No erythema. No pallor.  Psychiatric: She has a normal mood and affect. Her behavior is normal. Judgment and thought content normal.          Assessment & Plan:  1. Routine general medical examination at a health care facility Patient's physical exam is normal except for obesity. Schedule patient for mammogram after September. I also scheduled the patient to see gastroenterology for a screening colonoscopy. Patient may also require an EGD otherwise lab work is excellent. She received Tdap today. Patient received Zostavax last year. Therefore all  immunizations are up-to-date.  2. Gastroesophageal reflux disease with esophagitis Recommended the patient discontinue all NSAIDs. Discontinue omeprazole and switch to protonic 40 mg by mouth twice a day. Elevate the head of the bed one to 2 inches. Proceed with GI consult for possible EGD. - pantoprazole (PROTONIX) 40 MG tablet; Take 1 tablet (40 mg total) by mouth 2 (two) times daily.  Dispense: 60 tablet; Refill: 3

## 2014-04-13 ENCOUNTER — Encounter: Payer: Self-pay | Admitting: *Deleted

## 2014-05-17 ENCOUNTER — Telehealth: Payer: Self-pay | Admitting: Family Medicine

## 2014-05-17 NOTE — Telephone Encounter (Signed)
Patient is calling about her colonoscopy appointment she has not heard anything from [place]  [phone]

## 2014-05-18 ENCOUNTER — Ambulatory Visit
Admission: RE | Admit: 2014-05-18 | Discharge: 2014-05-18 | Disposition: A | Discharge status: Home / Self Care | Payer: No Typology Code available for payment source | Source: Ambulatory Visit | Attending: Family Medicine | Admitting: Family Medicine

## 2014-05-18 DIAGNOSIS — Z0000 Encounter for general adult medical examination without abnormal findings: Secondary | ICD-10-CM

## 2014-05-18 NOTE — Telephone Encounter (Signed)
Pt is aware that  Notes    User   Comments   Daylene Posey, [facility] 04/12/2014 3:04 PM faxed referral to [facility] [place], pending appt   Daylene Posey, [facility] 05/14/2014 11:50 AM faxed [facility] on status of appt   Daylene Posey, [facility] 05/14/2014 12:54 PM rec'd fax confirmation

## 2014-07-03 ENCOUNTER — Ambulatory Visit: Payer: Self-pay | Admitting: Gastroenterology

## 2014-07-03 HISTORY — PX: COLONOSCOPY WITH ESOPHAGOGASTRODUODENOSCOPY (EGD): SHX5779

## 2014-07-16 ENCOUNTER — Encounter: Payer: Self-pay | Admitting: Family Medicine

## 2014-08-13 ENCOUNTER — Encounter: Payer: Self-pay | Admitting: Family Medicine

## 2014-08-13 DIAGNOSIS — K219 Gastro-esophageal reflux disease without esophagitis: Secondary | ICD-10-CM | POA: Insufficient documentation

## 2015-01-07 LAB — SURGICAL PATHOLOGY

## 2018-05-31 ENCOUNTER — Other Ambulatory Visit: Payer: Self-pay | Admitting: Family Medicine

## 2018-05-31 DIAGNOSIS — Z1231 Encounter for screening mammogram for malignant neoplasm of breast: Secondary | ICD-10-CM

## 2018-06-13 ENCOUNTER — Ambulatory Visit
Admission: RE | Admit: 2018-06-13 | Discharge: 2018-06-13 | Disposition: A | Discharge status: Home / Self Care | Payer: Medicare Other | Source: Ambulatory Visit | Attending: Family Medicine | Admitting: Family Medicine

## 2018-06-13 DIAGNOSIS — Z1231 Encounter for screening mammogram for malignant neoplasm of breast: Secondary | ICD-10-CM | POA: Diagnosis present

## 2019-10-25 HISTORY — PX: COLONOSCOPY: SHX174

## 2020-02-27 ENCOUNTER — Other Ambulatory Visit: Payer: Self-pay | Admitting: Physical Medicine and Rehabilitation

## 2020-02-27 DIAGNOSIS — M5431 Sciatica, right side: Secondary | ICD-10-CM

## 2020-02-27 DIAGNOSIS — M545 Low back pain, unspecified: Secondary | ICD-10-CM

## 2020-04-01 ENCOUNTER — Other Ambulatory Visit: Payer: Self-pay

## 2020-04-01 ENCOUNTER — Ambulatory Visit
Admission: RE | Admit: 2020-04-01 | Discharge: 2020-04-01 | Disposition: A | Discharge status: Home / Self Care | Payer: Medicare Other | Source: Ambulatory Visit | Attending: Physical Medicine and Rehabilitation | Admitting: Physical Medicine and Rehabilitation

## 2020-04-01 DIAGNOSIS — M5431 Sciatica, right side: Secondary | ICD-10-CM

## 2020-04-01 DIAGNOSIS — M545 Low back pain, unspecified: Secondary | ICD-10-CM

## 2020-07-10 ENCOUNTER — Other Ambulatory Visit: Payer: Self-pay | Admitting: Family Medicine

## 2020-07-10 ENCOUNTER — Other Ambulatory Visit (HOSPITAL_COMMUNITY): Payer: Self-pay | Admitting: Family Medicine

## 2020-07-10 DIAGNOSIS — R55 Syncope and collapse: Secondary | ICD-10-CM

## 2020-07-12 ENCOUNTER — Other Ambulatory Visit: Payer: Self-pay

## 2020-07-12 ENCOUNTER — Ambulatory Visit
Admission: RE | Admit: 2020-07-12 | Discharge: 2020-07-12 | Disposition: A | Discharge status: Home / Self Care | Payer: Medicare Other | Source: Ambulatory Visit | Attending: Family Medicine | Admitting: Family Medicine

## 2020-07-12 DIAGNOSIS — R55 Syncope and collapse: Secondary | ICD-10-CM | POA: Insufficient documentation

## 2021-02-18 ENCOUNTER — Ambulatory Visit: Payer: Medicare Other | Admitting: Dermatology

## 2021-02-19 ENCOUNTER — Other Ambulatory Visit: Payer: Self-pay

## 2021-02-19 ENCOUNTER — Ambulatory Visit (INDEPENDENT_AMBULATORY_CARE_PROVIDER_SITE_OTHER): Payer: Medicare Other | Admitting: Dermatology

## 2021-02-19 DIAGNOSIS — D2339 Other benign neoplasm of skin of other parts of face: Secondary | ICD-10-CM

## 2021-02-19 DIAGNOSIS — Z86007 Personal history of in-situ neoplasm of skin: Secondary | ICD-10-CM

## 2021-02-19 DIAGNOSIS — L578 Other skin changes due to chronic exposure to nonionizing radiation: Secondary | ICD-10-CM | POA: Diagnosis not present

## 2021-02-19 DIAGNOSIS — D239 Other benign neoplasm of skin, unspecified: Secondary | ICD-10-CM

## 2021-02-19 DIAGNOSIS — D485 Neoplasm of uncertain behavior of skin: Secondary | ICD-10-CM

## 2021-02-19 NOTE — Progress Notes (Signed)
   Follow-Up Visit   Subjective  Emily Singleton is a 70 y.o. female who presents for the following: Spot Check (Pt has a spot on her forehead that she states has been there for approx 6 months. She states that it feels like it has got harder but other wise is not itchy or painful. ).    The following portions of the chart were reviewed this encounter and updated as appropriate:       Review of Systems: No other skin or systemic complaints except as noted in HPI or Assessment and Plan.   Objective  Well appearing patient in no apparent distress; mood and affect are within normal limits.  A focused examination was performed including face. Relevant physical exam findings are noted in the Assessment and Plan.  right mid forehead 4 mm pink flesh firm papule        Assessment & Plan  Neoplasm of uncertain behavior of skin right mid forehead  Epidermal / dermal shaving  Lesion diameter (cm):  0.6 Informed consent: discussed and consent obtained   Patient was prepped and draped in usual sterile fashion: Area prepped with alcohol. Anesthesia: the lesion was anesthetized in a standard fashion   Anesthetic:  1% lidocaine w/ epinephrine 1-100,000 buffered w/ 8.4% NaHCO3 Instrument used: flexible razor blade   Hemostasis achieved with: pressure, aluminum chloride and electrodesiccation   Outcome: patient tolerated procedure well    Destruction of lesion  Destruction method: electrodesiccation and curettage   Timeout:  patient name, date of birth, surgical site, and procedure verified Curettage performed in three different directions: Yes   Electrodesiccation performed over the curetted area: Yes   Lesion length (cm):  0.4 Lesion width (cm):  0.4 Margin per side (cm):  0.1 Final wound size (cm):  0.6 Hemostasis achieved with:  pressure, aluminum chloride and electrodesiccation Outcome: patient tolerated procedure well with no complications   Post-procedure details:  wound care instructions given   Additional details:  Mupirocin ointment and Bandaid applied    Specimen 1 - Surgical pathology Differential Diagnosis: r/o BCC  Check Margins: Yes Right mid forehead 4 mm pink flesh firm papule  2 specimens  Shave/EDC, r/o BCC Discussed resulting scar  Actinic Damage - chronic, secondary to cumulative UV radiation exposure/sun exposure over time - diffuse scaly erythematous macules with underlying dyspigmentation - Recommend daily broad spectrum sunscreen SPF 30+ to sun-exposed areas, reapply every 2 hours as needed.  - Recommend staying in the shade or wearing long sleeves, sun glasses (UVA+UVB protection) and wide brim hats (4-inch brim around the entire circumference of the hat). - Call for new or changing lesions.   History of Squamous Cell Carcinoma in Situ of the Skin - No evidence of recurrence today - Recommend regular full body skin exams - Recommend daily broad spectrum sunscreen SPF 30+ to sun-exposed areas, reapply every 2 hours as needed.  - Call if any new or changing lesions are noted between office visits  Return if symptoms worsen or fail to improve.   I, Harriett Sine, CMA, am acting as scribe for Brendolyn Patty, MD.  Documentation: I have reviewed the above documentation for accuracy and completeness, and I agree with the above.  Brendolyn Patty MD

## 2021-02-19 NOTE — Patient Instructions (Signed)
Wound Care Instructions  1. Cleanse wound gently with soap and water once a day then pat dry with clean gauze. Apply a thing coat of Petrolatum (petroleum jelly, "Vaseline") over the wound (unless you have an allergy to this). We recommend that you use a new, sterile tube of Vaseline. Do not pick or remove scabs. Do not remove the yellow or white "healing tissue" from the base of the wound.  2. Cover the wound with fresh, clean, nonstick gauze and secure with paper tape. You may use Band-Aids in place of gauze and tape if the would is small enough, but would recommend trimming much of the tape off as there is often too much. Sometimes Band-Aids can irritate the skin.  3. You should call the office for your biopsy report after 1 week if you have not already been contacted.  4. If you experience any problems, such as abnormal amounts of bleeding, swelling, significant bruising, significant pain, or evidence of infection, please call the office immediately.  5. FOR ADULT SURGERY PATIENTS: If you need something for pain relief you may take 1 extra strength Tylenol (acetaminophen) AND 2 Ibuprofen (200mg  each) together every 4 hours as needed for pain. (do not take these if you are allergic to them or if you have a reason you should not take them.) Typically, you may only need pain medication for 1 to 3 days.      Electrodesiccation and Curettage ("Scrape and Burn") Wound Care Instructions  6. Leave the original bandage on for 24 hours if possible.  If the bandage becomes soaked or soiled before that time, it is OK to remove it and examine the wound.  A small amount of post-operative bleeding is normal.  If excessive bleeding occurs, remove the bandage, place gauze over the site and apply continuous pressure (no peeking) over the area for 30 minutes. If this does not work, please call our clinic as soon as possible or page your doctor if it is after hours.   7. Once a day, cleanse the wound with soap  and water. It is fine to shower. If a thick crust develops you may use a Q-tip dipped into dilute hydrogen peroxide (mix 1:1 with water) to dissolve it.  Hydrogen peroxide can slow the healing process, so use it only as needed.    8. After washing, apply petroleum jelly (Vaseline) or an antibiotic ointment if your doctor prescribed one for you, followed by a bandage.    9. For best healing, the wound should be covered with a layer of ointment at all times. If you are not able to keep the area covered with a bandage to hold the ointment in place, this may mean re-applying the ointment several times a day.  Continue this wound care until the wound has healed and is no longer open. It may take several weeks for the wound to heal and close.  Itching and mild discomfort is normal during the healing process.  If you have any discomfort, you can take Tylenol (acetaminophen) or ibuprofen as directed on the bottle. (Please do not take these if you have an allergy to them or cannot take them for another reason).  Some redness, tenderness and white or yellow material in the wound is normal healing.  If the area becomes very sore and red, or develops a thick yellow-green material (pus), it may be infected; please notify us.    Wound healing continues for up to one year following surgery. It is not  unusual to experience pain in the scar from time to time during the interval.  If the pain becomes severe or the scar thickens, you should notify the office.    A slight amount of redness in a scar is expected for the first six months.  After six months, the redness will fade and the scar will soften and fade.  The color difference becomes less noticeable with time.  If there are any problems, return for a post-op surgery check at your earliest convenience.  To improve the appearance of the scar, you can use silicone scar gel, cream, or sheets (such as Mederma or Serica) every night for up to one year. These are  available over the counter (without a prescription).  Please call our office at [phone] for any questions or concerns.

## 2021-02-25 ENCOUNTER — Telehealth: Payer: Self-pay

## 2021-02-25 NOTE — Telephone Encounter (Signed)
-----   Message from Brendolyn Patty, MD sent at 02/25/2021  4:55 PM EDT ----- Skin , right mid forehead, shave EDC CUTANEOUS MIXED TUMOR, (CHONDROID SYRINGOMA ), PERIPHERAL AND DEEP MARGINS INVOLVED  Benign tumor, already treated with EDC. If recurs, recommend excision

## 2021-02-25 NOTE — Telephone Encounter (Signed)
Left message for patient to call for bx results.  

## 2021-03-03 ENCOUNTER — Telehealth: Payer: Self-pay

## 2021-03-03 NOTE — Telephone Encounter (Signed)
Advised pt of bx results/sh ?

## 2021-03-03 NOTE — Telephone Encounter (Signed)
-----   Message from Brendolyn Patty, MD sent at 02/25/2021  4:55 PM EDT ----- Skin , right mid forehead, shave EDC CUTANEOUS MIXED TUMOR, (CHONDROID SYRINGOMA ), PERIPHERAL AND DEEP MARGINS INVOLVED  Benign tumor, already treated with EDC. If recurs, recommend excision

## 2021-05-15 IMAGING — CT CT HEAD W/O CM
4 series · 16 of 47 positions shown, 18 images · non-contrast
Comparison: None.

CLINICAL DATA: Dizziness

EXAM:
CT HEAD WITHOUT CONTRAST
TECHNIQUE: Contiguous axial images were obtained from the base of the skull
through the vertex without intravenous contrast.

[Series 2: head wo · axial · 0.41mm/px · z∈[-145,-45]mm · 7 of 28 slices shown, 9 images]
[im 4/28  brain]
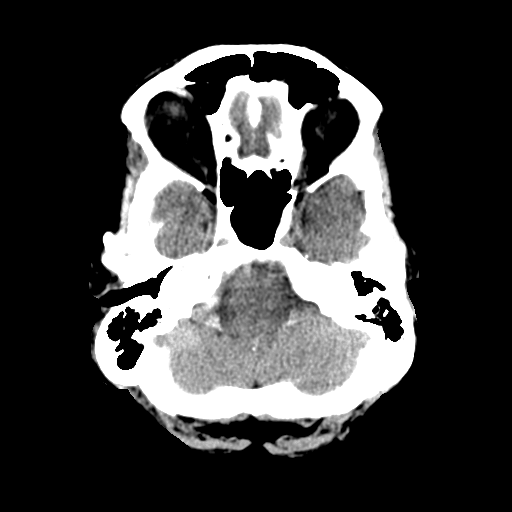
[im 4/28  bone]
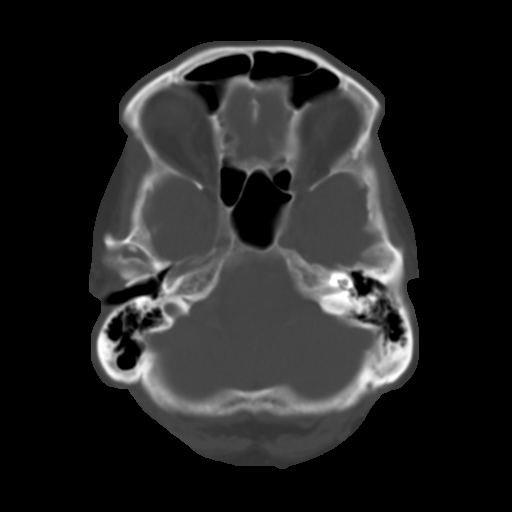
[im 7/28  brain]
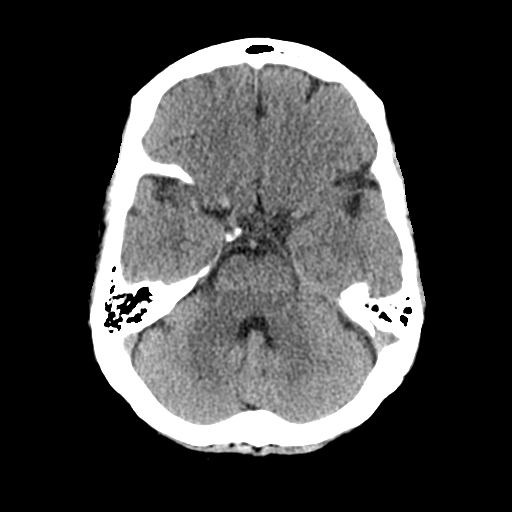
[im 11/28  brain]
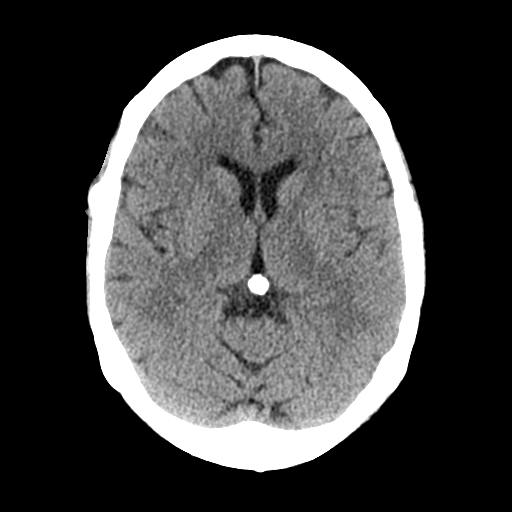
[im 14/28  brain]
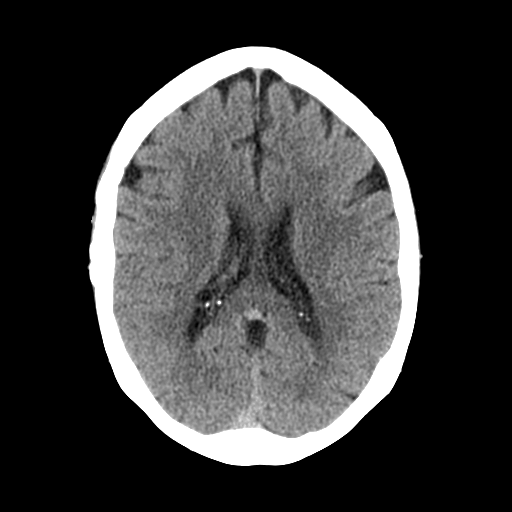
[im 17/28  brain]
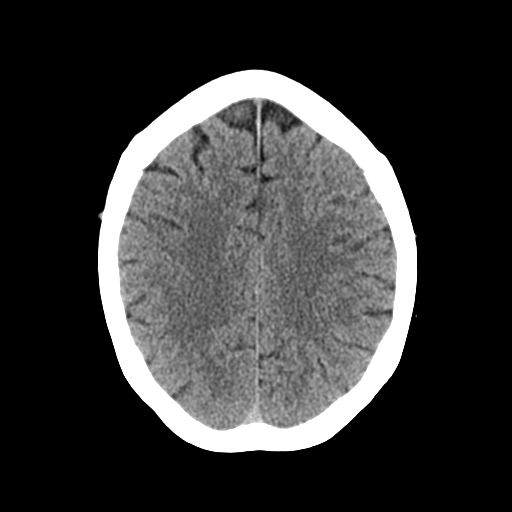
[im 17/28  bone]
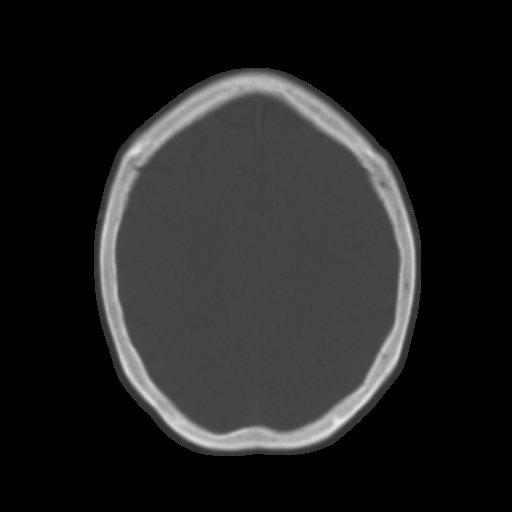
[im 21/28  brain]
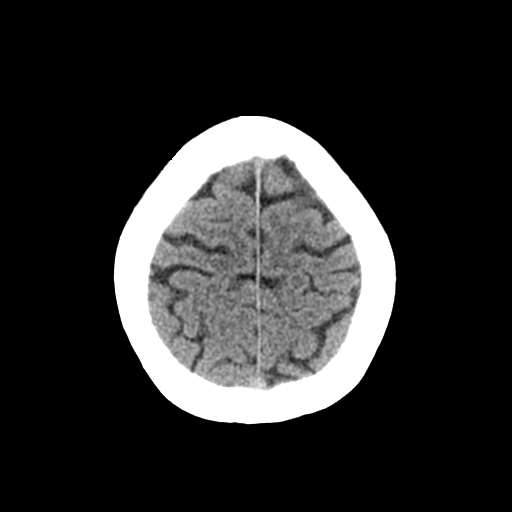
[im 24/28  brain]
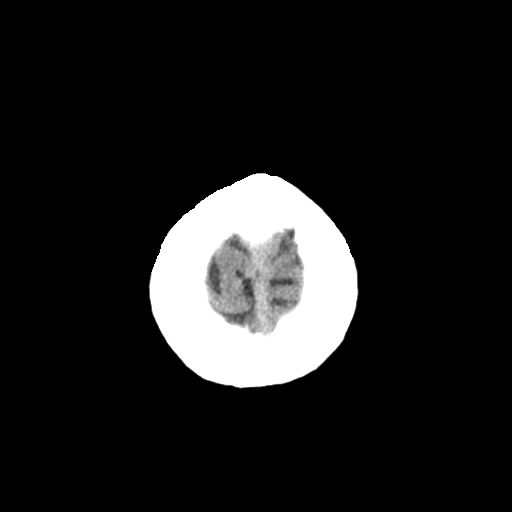

[Series 3: head bone · axial · 0.41mm/px · z∈[-148,-120]mm · 3 of 69 slices shown]
[im 7/69  bone]
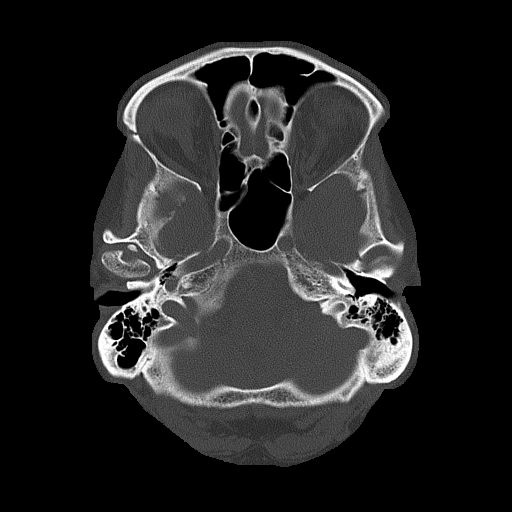
[im 14/69  bone]
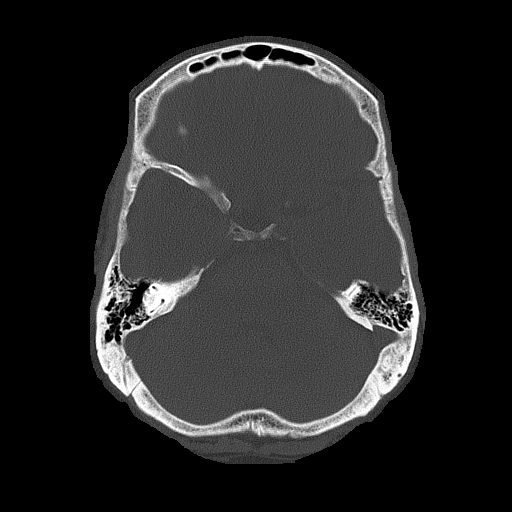
[im 21/69  bone]
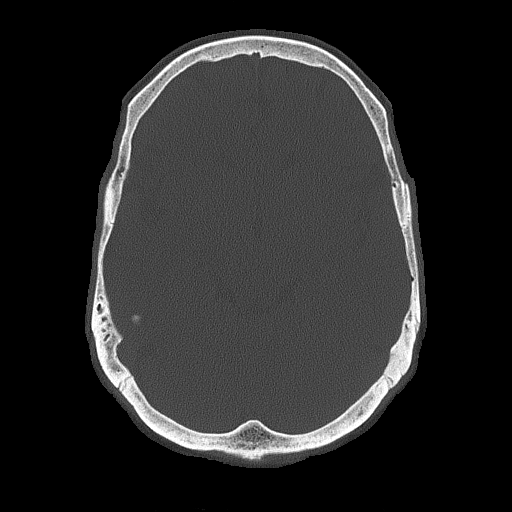

[Series 4: coronal soft tissue · coronal · 0.28mm/px · 3 of 63 slices shown]
[im 21/63  brain]
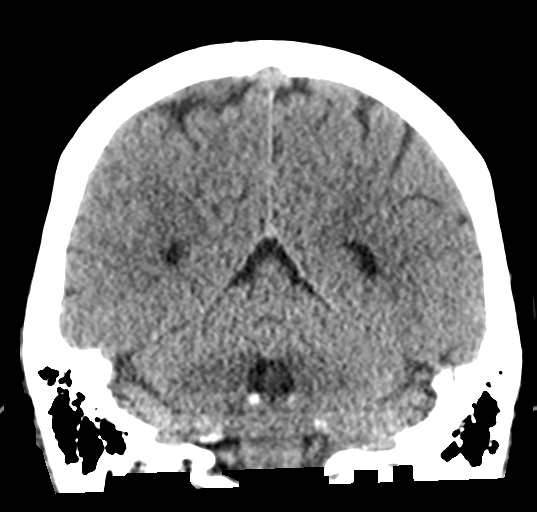
[im 28/63  brain]
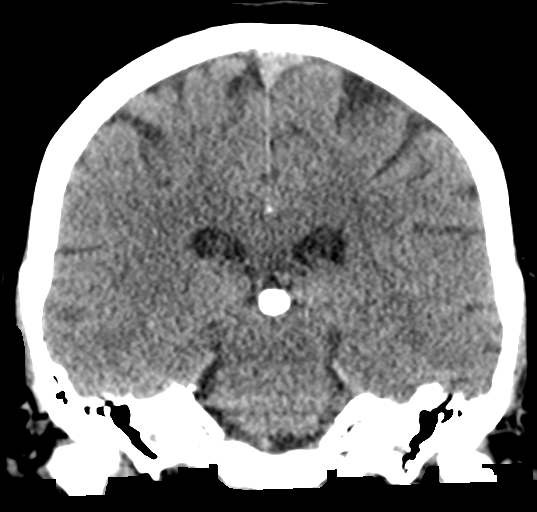
[im 35/63  brain]
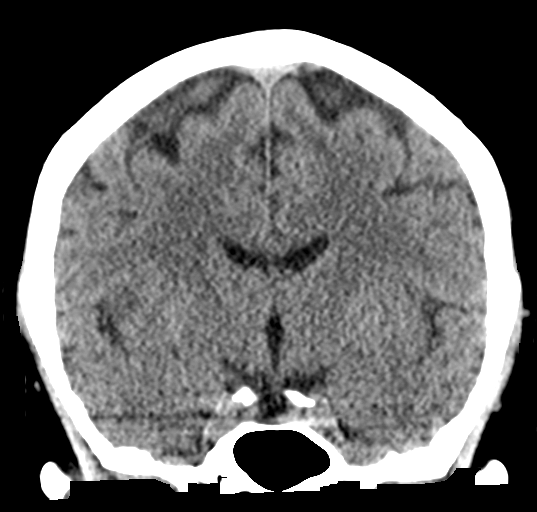

[Series 5: sagittal soft tissue · sagittal · 0.28mm/px · 3 of 50 slices shown]
[im 17/50  brain]
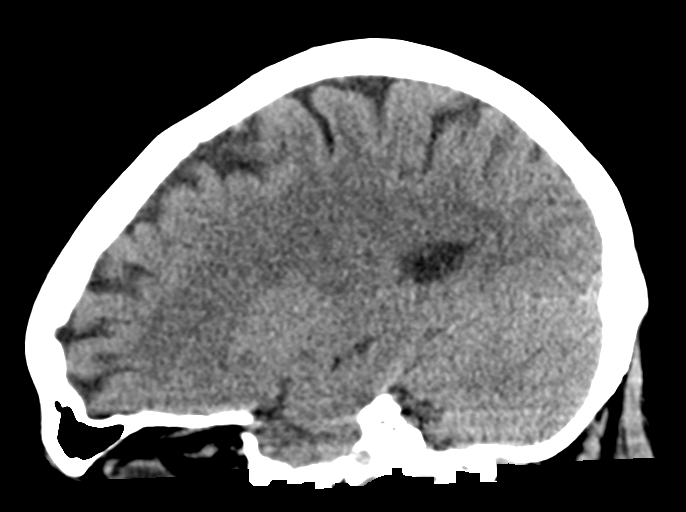
[im 25/50  brain]
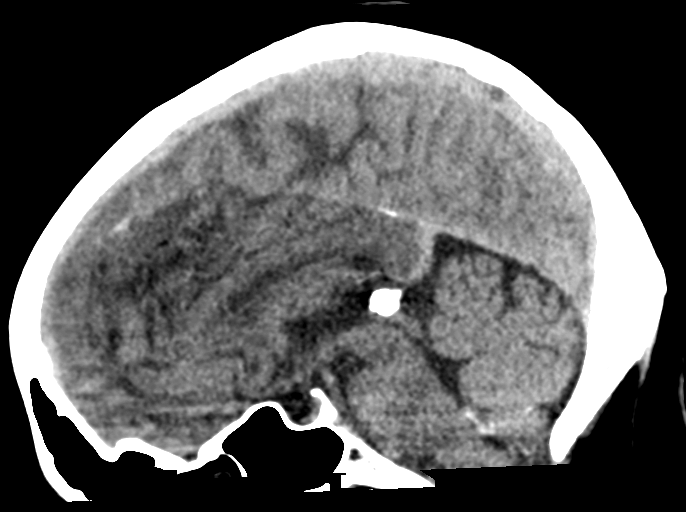
[im 33/50  brain]
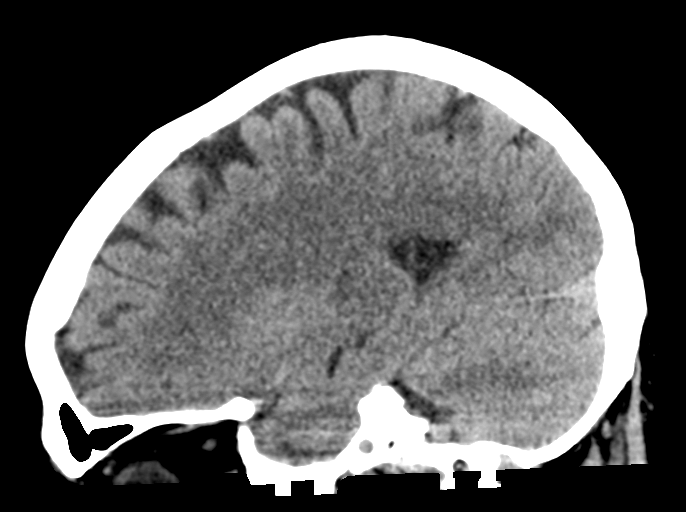

[16 of 47 positions shown; findings below may reference images not displayed]

FINDINGS: Brain: No acute intracranial abnormality. Specifically, no
hemorrhage, hydrocephalus, mass lesion, acute infarction, or
significant intracranial injury.

Vascular: No hyperdense vessel or unexpected calcification.

Skull: No acute calvarial abnormality.

Sinuses/Orbits: Visualized paranasal sinuses and mastoids clear.
Orbital soft tissues unremarkable.

Other: None
IMPRESSION: No acute intracranial abnormality.

## 2021-07-15 ENCOUNTER — Ambulatory Visit (INDEPENDENT_AMBULATORY_CARE_PROVIDER_SITE_OTHER): Payer: Medicare Other | Admitting: Dermatology

## 2021-07-15 ENCOUNTER — Other Ambulatory Visit: Payer: Self-pay

## 2021-07-15 DIAGNOSIS — L820 Inflamed seborrheic keratosis: Secondary | ICD-10-CM | POA: Diagnosis not present

## 2021-07-15 DIAGNOSIS — L853 Xerosis cutis: Secondary | ICD-10-CM | POA: Diagnosis not present

## 2021-07-15 NOTE — Progress Notes (Signed)
   Follow-Up Visit   Subjective  Emily Singleton is a 70 y.o. female who presents for the following: Skin Problem (Check a spot on the left leg growing and changing color ).   The following portions of the chart were reviewed this encounter and updated as appropriate:   Tobacco  Allergies  Meds  Problems  Med Hx  Surg Hx  Fam Hx      Review of Systems:  No other skin or systemic complaints except as noted in HPI or Assessment and Plan.  Objective  Well appearing patient in no apparent distress; mood and affect are within normal limits.  A focused examination was performed including left thigh. Relevant physical exam findings are noted in the Assessment and Plan.  Left Thigh - Anterior Erythematous keratotic or waxy stuck-on papule or plaque.   back Dry skin    Assessment & Plan  Inflamed seborrheic keratosis Left Thigh - Anterior  Symptomatic  Prior to procedure, discussed risks of blister formation, small wound, skin dyspigmentation, or rare scar following cryotherapy. Recommend Vaseline ointment to treated areas while healing.   Destruction of lesion - Left Thigh - Anterior Complexity: simple   Destruction method: cryotherapy   Informed consent: discussed and consent obtained   Timeout:  patient name, date of birth, surgical site, and procedure verified Lesion destroyed using liquid nitrogen: Yes   Region frozen until ice ball extended beyond lesion: Yes   Outcome: patient tolerated procedure well with no complications   Post-procedure details: wound care instructions given    Xerosis cutis back  - diffuse xerotic patches - recommend gentle, hydrating skin care - gentle skin care handout given   Keep scheduled appointment. Call if not resolved.  I, Marye Round, CMA, am acting as scribe for Forest Gleason, MD .   Documentation: I have reviewed the above documentation for accuracy and completeness, and I agree with the above.  Forest Gleason, MD

## 2021-07-15 NOTE — Patient Instructions (Addendum)
Recommend taking Heliocare sun protection supplement daily in sunny weather for additional sun protection. For maximum protection on the sunniest days, you can take up to 2 capsules of regular Heliocare OR take 1 capsule of Heliocare Ultra. For prolonged exposure (such as a full day in the sun), you can repeat your dose of the supplement 4 hours after your first dose. Heliocare can be purchased at [facility] or at [email or web address].      Cryotherapy Aftercare  Wash gently with soap and water everyday.   Apply Vaseline and Band-Aid daily until healed.  Prior to procedure, discussed risks of blister formation, small wound, skin dyspigmentation, or rare scar following cryotherapy. Recommend Vaseline ointment to treated areas while healing.     Gentle Skin Care Guide  1. Bathe no more than once a day.  2. Avoid bathing in hot water  3. Use a mild soap like Dove, Vanicream, Cetaphil, CeraVe. Can use Lever 2000 or Cetaphil antibacterial soap  4. Use soap only where you need it. On most days, use it under your arms, between your legs, and on your feet. Let the water rinse other areas unless visibly dirty.  5. When you get out of the bath/shower, use a towel to gently blot your skin dry, don't rub it.  6. While your skin is still a little damp, apply a moisturizing cream such as Vanicream, CeraVe, Cetaphil, Eucerin, Sarna lotion or plain Vaseline Jelly. For hands apply Neutrogena [place] Hand Cream or Excipial Hand Cream.  7. Reapply moisturizer any time you start to itch or feel dry.  8. Sometimes using free and clear laundry detergents can be helpful. Fabric softener sheets should be avoided. Downy Free & Gentle liquid, or any liquid fabric softener that is free of dyes and perfumes, it acceptable to use  9. If your doctor has given you prescription creams you may apply moisturizers over them        If you have any questions or concerns for your doctor, please call our main  line at [phone] and press option 4 to reach your doctor's medical assistant. If no one answers, please leave a voicemail as directed and we will return your call as soon as possible. Messages left after 4 pm will be answered the following business day.   You may also send [place] a message via [place]. We typically respond to MyChart messages within 1-2 business days.  For prescription refills, please ask your pharmacy to contact our office. Our fax number is [phone].  If you have an urgent issue when the clinic is closed that cannot wait until the next business day, you can page your doctor at the number below.    Please note that while we do our best to be available for urgent issues outside of office hours, we are not available 24/7.   If you have an urgent issue and are unable to reach [place], you may choose to seek medical care at your doctor's office, retail clinic, urgent care center, or emergency room.  If you have a medical emergency, please immediately call 911 or go to the emergency department.  Pager Numbers  - Dr. Nehemiah Massed: [phone]  - Dr. Laurence Ferrari: [phone]  - Dr. Nicole Kindred: [phone]  In the event of inclement weather, please call our main line at [phone] for an update on the status of any delays or closures.  Dermatology Medication Tips: Please keep the boxes that topical medications come in in order to help keep track of the  instructions about where and how to use these. Pharmacies typically print the medication instructions only on the boxes and not directly on the medication tubes.   If your medication is too expensive, please contact our office at [phone] option 4 or send [place] a message through [place].   We are unable to tell what your co-pay for medications will be in advance as this is different depending on your insurance coverage. However, we may be able to find a substitute medication at lower cost or fill out paperwork to get insurance to cover a  needed medication.   If a prior authorization is required to get your medication covered by your insurance company, please allow [place] 1-2 business days to complete this process.  Drug prices often vary depending on where the prescription is filled and some pharmacies may offer cheaper prices.  The website www.goodrx.com contains coupons for medications through different pharmacies. The prices here do not account for what the cost may be with help from insurance (it may be cheaper with your insurance), but the website can give you the price if you did not use any insurance.  - You can print the associated coupon and take it with your prescription to the pharmacy.  - You may also stop by our office during regular business hours and pick up a GoodRx coupon card.  - If you need your prescription sent electronically to a different pharmacy, notify our office through [facility] or by phone at [phone] option 4.

## 2021-07-23 ENCOUNTER — Encounter: Payer: Self-pay | Admitting: Dermatology

## 2022-08-03 ENCOUNTER — Other Ambulatory Visit: Payer: Self-pay | Admitting: Otolaryngology

## 2022-08-03 DIAGNOSIS — R1310 Dysphagia, unspecified: Secondary | ICD-10-CM

## 2022-08-03 DIAGNOSIS — H9201 Otalgia, right ear: Secondary | ICD-10-CM

## 2022-08-18 ENCOUNTER — Ambulatory Visit
Admission: RE | Admit: 2022-08-18 | Discharge: 2022-08-18 | Disposition: A | Discharge status: Home / Self Care | Payer: Medicare Other | Source: Ambulatory Visit | Attending: Otolaryngology | Admitting: Otolaryngology

## 2022-08-18 DIAGNOSIS — R1310 Dysphagia, unspecified: Secondary | ICD-10-CM

## 2022-08-18 DIAGNOSIS — H9201 Otalgia, right ear: Secondary | ICD-10-CM

## 2022-08-18 MED ORDER — IOPAMIDOL (ISOVUE-300) INJECTION 61%
75.0000 mL | Freq: Once | INTRAVENOUS | Status: AC | PRN
  Administered 2022-08-18: 75 mL via INTRAVENOUS

## 2022-08-28 ENCOUNTER — Other Ambulatory Visit: Payer: Self-pay | Admitting: Family Medicine

## 2022-08-28 DIAGNOSIS — I700 Atherosclerosis of aorta: Secondary | ICD-10-CM

## 2022-08-28 DIAGNOSIS — Z9189 Other specified personal risk factors, not elsewhere classified: Secondary | ICD-10-CM

## 2022-09-18 ENCOUNTER — Ambulatory Visit
Admission: RE | Admit: 2022-09-18 | Discharge: 2022-09-18 | Disposition: A | Discharge status: Home / Self Care | Payer: No Typology Code available for payment source | Source: Ambulatory Visit | Attending: Family Medicine | Admitting: Family Medicine

## 2022-09-18 DIAGNOSIS — Z9189 Other specified personal risk factors, not elsewhere classified: Secondary | ICD-10-CM

## 2022-09-18 DIAGNOSIS — I700 Atherosclerosis of aorta: Secondary | ICD-10-CM

## 2023-01-26 NOTE — Progress Notes (Signed)
Referring Physician:  Marisue Ivan, MD [phone] [facility] [address] [facility] [place],  [place] [address]  Primary Physician:  Marisue Ivan, MD  History of Present Illness: 01/29/2023 [name] has a history of GAD, obesity, vitamin D  deficiency, aortic atherosclerosis, IBS, and osteopenia.   2 year history of constant LBP that radiates into the bilateral buttocks and into the posterior legs (right > left), occasionally it will stop at the calf and other times it will go into the foot. She also complains of weakness in the right leg. Pain is worse with walking, standing, and bending. Some relief with sitting. No numbness or tingling.   She has known OA in left knee and was told she needs a TKA.   No urinary issues- she has some urgency. She has chronic bowel incontinence x years.   She does not smoke.   Conservative measures:  Physical therapy: ordered by PMR 10/2022, but has not participated in  Multimodal medical therapy including regular antiinflammatories: celebrex, cymbalta, tylenol arthritis Injections:  10/14/2022: Right L4-5 and right S1 transforaminal ESI (nearly complete relief x 3 weeks, then sustained 50% relief, dexamethasone 13 mg) 09/10/2022: Right L4-5 and right S1 transforaminal ESI (good relief x 5 days, then 30-40% relief) 07/07/2022: Right hip joint injection (resolution of groin pain) 05/19/2022: RFA to the right L3-4 and L4-5 facet joints (moderate to good relief) 04/30/2022: MBB to the right L3-4 and L4-5 facet joints (9/10 to 0/10) 04/16/2022: MBB to the right L3-4 and L4-5 facet joints (9/10 to 0/10) 11/18/2021: Right L4-5 and right S1 transforaminal ESI (moderate to good relief) 06/27/2021: Right L4-5 and right S1 transforaminal ESI (moderate to good relief) 04/10/2021: Left C4 trigger point injection (good relief) 10/14/2020: Left C4 trigger point injection (good relief) 05/13/2020: Right L4-5 and right S1 transforaminal ESI  (moderate relief) 04/03/2020: Right L4-5 and right L5-S1 transforaminal ESI (good relief of right posterior thigh and posterior calf pain, minimal improvement of right buttock pain) 02/02/2020: Right sacroiliac joint injection (mild to moderate relief) 02/18/2017: Right sacroiliac joint injection (good relief) 05/05/2016: Right sacroiliac joint injection (good relief)   Past Surgery: denies  Emily Singleton has no symptoms of cervical myelopathy.  The symptoms are causing a significant impact on the patient's life.  Review of Systems:  A 10 point review of systems is negative, except for the pertinent positives and negatives detailed in the HPI.  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   meloxicam (MOBIC) 15 MG tablet Take 1 tablet (15 mg total) by mouth daily.   pantoprazole (PROTONIX) 40 MG tablet Take 1 tablet (40 mg total) by mouth 2 (two) times daily.   No facility-administered encounter medications on file as of 01/29/2023.    Social History: Social  History   Tobacco Use   Smoking status: Former   Smokeless tobacco: Never  Substance Use Topics   Alcohol use: Yes    Comment: occasional   Drug use: No    Family Medical History: Family History  Problem Relation Age of Onset   Cancer  Sister        colon age 21   Breast cancer Maternal Aunt 47    Physical Examination: There were no vitals filed for this visit.  General: Patient is well developed, well nourished, calm, collected, and in no apparent distress. Attention to examination is appropriate.  Respiratory: Patient is breathing without any difficulty.   NEUROLOGICAL:     Awake, alert, oriented to person, place, and time.  Speech is clear and fluent. Fund of knowledge is appropriate.   Cranial Nerves: Pupils equal round and reactive to light.  Facial tone is symmetric.    Mild posterior lumbar tenderness.   No abnormal lesions on exposed skin.   Strength: Side Biceps Triceps Deltoid Interossei Grip Wrist Ext. Wrist Flex.  R 5 5 5 5 5 5 5   L 5 5 5 5 5 5 5    Side Iliopsoas Quads Hamstring PF DF EHL  R 5 5 5 5 5 5   L 5 5 5 5 5 5    Reflexes are 2+ and symmetric at the biceps, triceps, brachioradialis, patella and achilles.   Hoffman's is absent.  Clonus is not present.   Bilateral upper and lower extremity sensation is intact to light touch.     Gait is normal.     Medical Decision Making  Imaging: Lumbar MRI dated 04/01/20:  FINDINGS: Segmentation: Unchanged transitional lumbosacral anatomy with partial sacralization of L5 on the right.   Alignment: Unchanged mild levocurvature. Unchanged trace retrolisthesis at T12-L1 and L1-L2. Unchanged trace anterolisthesis at L3-L4. Unchanged 5 mm anterolisthesis at L4-L5.   Vertebrae:  No fracture, evidence of discitis, or bone lesion.   Conus medullaris and cauda equina: Conus extends to the L1-L2 level. Conus and cauda equina appear normal.   Paraspinal and other soft tissues: Negative.   Disc levels:   T12-L1: Mild disc bulging and bilateral facet arthropathy. No stenosis.   L1-L2: Mild disc bulging and bilateral facet arthropathy. Mild right lateral recess stenosis. No spinal canal or neuroforaminal stenosis.   L2-L3: Mild disc bulging and  bilateral facet arthropathy. Mild right greater than left lateral recess stenosis. No spinal canal or neuroforaminal stenosis.   L3-L4: Mild disc bulging. Severe bilateral facet arthropathy with ligamentum flavum hypertrophy. Severe spinal canal stenosis. Mild bilateral neuroforaminal stenosis.   L4-L5: Small broad-based left paracentral and subarticular disc protrusion. Severe bilateral facet arthropathy. Mild spinal canal stenosis. Mild to moderate left lateral recess stenosis. No neuroforaminal stenosis.   L5-S1: Tiny central disc protrusion. Moderate bilateral facet arthropathy. No stenosis.   IMPRESSION: 1. Multilevel lumbar spondylosis as described above. Severe spinal canal stenosis at L3-L4. 2. Mild spinal canal stenosis at L4-L5.     Electronically Signed   By: Obie Dredge M.D.   On: 04/03/2020 09:17        I have personally reviewed the images and agree with the above interpretation.  Assessment and Plan: Ms. Deutmeyer is a pleasant 72 y.o. female has 2 year history of constant LBP that radiates into the bilateral buttocks and into the posterior legs (right > left), occasionally it will stop at the calf and other times it will go into the foot. Pain is worse with walking,  standing, and bending.   MRI from 2021 shows slip at L3-L4 and L4-L5. 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   She is interested in any possible surgery options for her lumbar spine.   Treatment options discussed with patient and following plan made:   - Order for physical therapy for lumbar spine sent to [facility]. Patient to call to schedule appointment.  - MRI of lumbar spine to further evaluate bilateral lower extremity radiculopathy. No improvement with time, medications, or injections.  - Xrays of lumbar spine with flexion/extension to evaluate slip at L3-L4 and L4-L5.  - Will schedule phone visit to review MRI results once I get them back and then likely  with schedule follow up with Dr. Myer Haff once she completes PT to discuss possible surgery options.   I spent a total of 35 minutes in face-to-face and non-face-to-face activities related to this patient's care today including review of outside records, review of imaging, review of symptoms, physical exam, discussion of differential diagnosis, discussion of treatment options, and documentation.   Thank you for involving me in the care of this patient.   Drake Leach PA-C Dept. of Neurosurgery

## 2023-01-29 ENCOUNTER — Ambulatory Visit (INDEPENDENT_AMBULATORY_CARE_PROVIDER_SITE_OTHER): Payer: Medicare Other | Admitting: Orthopedic Surgery

## 2023-01-29 ENCOUNTER — Encounter: Payer: Self-pay | Admitting: Orthopedic Surgery

## 2023-01-29 VITALS — BP 124/74 | Ht 65.0 in | Wt 230.0 lb

## 2023-01-29 DIAGNOSIS — M4726 Other spondylosis with radiculopathy, lumbar region: Secondary | ICD-10-CM

## 2023-01-29 DIAGNOSIS — M48061 Spinal stenosis, lumbar region without neurogenic claudication: Secondary | ICD-10-CM | POA: Diagnosis not present

## 2023-01-29 DIAGNOSIS — M5416 Radiculopathy, lumbar region: Secondary | ICD-10-CM

## 2023-01-29 DIAGNOSIS — M47816 Spondylosis without myelopathy or radiculopathy, lumbar region: Secondary | ICD-10-CM

## 2023-01-29 NOTE — Patient Instructions (Signed)
It was so nice to see you today. Thank you so much for coming in.    Your imaging from 2021 showed wear and tear in your back along with spinal stenosis.    I want to get an MRI of your lower back to look into things further. We will get this approved through your insurance and outpatient imaging will call you to schedule the appointment.   I ordered xrays of your lower back. You can get these at [facility] when you get your MRI (building with the white pillars) off of [address]. The address is [address], [place], [place] [address].   I sent physical therapy orders to [facility]. You can call them at [phone] if you don't hear from them to schedule your visit.   Once I have the results, we will call you to schedule a phone visit with me to review them.   Please do not hesitate to call if you have any questions or concerns. You can also message me in MyChart.   Drake Leach PA-C [phone]

## 2023-02-04 ENCOUNTER — Ambulatory Visit
Admission: RE | Admit: 2023-02-04 | Discharge: 2023-02-04 | Disposition: A | Discharge status: Home / Self Care | Payer: Medicare Other | Source: Ambulatory Visit | Attending: Orthopedic Surgery | Admitting: Orthopedic Surgery

## 2023-02-04 DIAGNOSIS — M48061 Spinal stenosis, lumbar region without neurogenic claudication: Secondary | ICD-10-CM

## 2023-02-04 DIAGNOSIS — M5416 Radiculopathy, lumbar region: Secondary | ICD-10-CM

## 2023-02-04 DIAGNOSIS — M47816 Spondylosis without myelopathy or radiculopathy, lumbar region: Secondary | ICD-10-CM

## 2023-02-15 NOTE — Progress Notes (Unsigned)
Telephone Visit- Progress Note: Referring Physician:  Marisue Ivan, MD [phone] [facility] [address] [facility] [place],  [place] [address]  Primary Physician:  Marisue Ivan, MD  This visit was performed via telephone.  Patient location: home Provider location: office  I spent a total of 10 minutes non-face-to-face activities for this visit on the date of this encounter including review of current clinical condition and response to treatment.    Patient has given verbal consent to this telephone visits and we reviewed the limitations of a telephone visit. Patient wishes to proceed.    Chief Complaint:  review MRI/xray results  History of Present Illness: Emily Singleton is a 72 y.o. female has a history of GAD, obesity, vitamin D  deficiency, aortic atherosclerosis, IBS, and osteopenia.   Last seen by me on 01/29/23 for back and bilateral leg pain. MRI from 2021 showed slip at L3-L4 and L4-L5. 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   She was sent to PT at last visit and is here to review her lumbar MRI/xray results.   She has [facility] 4 [facility] of [facility] No change in symptoms. She continues with constant LBP that radiates into the bilateral buttocks and into the posterior legs (right > left), occasionally it will stop at the calf and other times it will go into the foot. Pain is worse with walking, standing, and bending. Some relief with sitting. No numbness or tingling.    No urinary issues- she has some urgency. She has chronic bowel incontinence x years.    She does not smoke.    Conservative measures:  Physical therapy: at [facility] for her back- has done 4 visits per patient Multimodal medical therapy including regular antiinflammatories: celebrex, cymbalta, tylenol arthritis Injections:  10/14/2022: Right L4-5 and right S1 transforaminal ESI (nearly complete relief x 3 weeks, then sustained 50% relief, dexamethasone 13  mg) 09/10/2022: Right L4-5 and right S1 transforaminal ESI (good relief x 5 days, then 30-40% relief) 07/07/2022: Right hip joint injection (resolution of groin pain) 05/19/2022: RFA to the right L3-4 and L4-5 facet joints (moderate to good relief) 04/30/2022: MBB to the right L3-4 and L4-5 facet joints (9/10 to 0/10) 04/16/2022: MBB to the right L3-4 and L4-5 facet joints (9/10 to 0/10) 11/18/2021: Right L4-5 and right S1 transforaminal ESI (moderate to good relief) 06/27/2021: Right L4-5 and right S1 transforaminal ESI (moderate to good relief) 04/10/2021: Left C4 trigger point injection (good relief) 10/14/2020: Left C4 trigger point injection (good relief) 05/13/2020: Right L4-5 and right S1 transforaminal ESI (moderate relief) 04/03/2020: Right L4-5 and right L5-S1 transforaminal ESI (good relief of right posterior thigh and posterior calf pain, minimal improvement of right buttock pain) 02/02/2020: Right sacroiliac joint injection (mild to moderate relief) 02/18/2017: Right sacroiliac joint injection (good relief) 05/05/2016: Right sacroiliac joint injection (good relief)    Past Surgery: denies    Exam: No exam done as this was a telephone encounter.     Imaging: Lumbar xrays dated 02/04/23:  FINDINGS: Frontal, lateral neutral, lateral flexion, lateral extension views of the lumbar spine are obtained. There are 5 non-rib-bearing lumbar type vertebral bodies again identified, with hemi sacralization of the L5 vertebral body on the right again noted. Left convex scoliosis centered at L3. Stable mild retrolisthesis of T12 relative to L1 and L1 relative to L2. Otherwise alignment is anatomic. There are no acute fractures. Prominent facet hypertrophic changes are seen from L3-4 through L5-S1. Mild disc  space narrowing at the L4-5 level. No instability during flexion or extension.   IMPRESSION: 1. Left convex lumbar scoliosis centered at L3. 2. Multilevel facet hypertrophic  changes from L3-4 through L5-S1. Mild spondylosis at L4-5. 3. No instability with flexion or extension.     Electronically Signed   By: Sharlet Salina M.D.   On: 02/10/2023 14:31   MRI of lumbar spine dated 02/04/23:  FINDINGS: Segmentation:  Standard.   Alignment: Degenerative anterolisthesis at L3-4 to L5-S1. Hyperlordosis.   Vertebrae:  No fracture, evidence of discitis, or bone lesion.   Conus medullaris and cauda equina: Conus extends to the L1-2 level. Conus and cauda equina appear normal.   Paraspinal and other soft tissues: Negative for perispinal mass or inflammation.   Disc levels:   T12- L1: Disc height loss and ventral spondylitic spurring. Mild disc bulging.   L1-L2: Disc narrowing and bulging. Facet spurring and ligamentum flavum thickening. Moderate spinal stenosis. Moderate right more than left foraminal narrowing   L2-L3: Disc narrowing and bulging. Degenerative facet spurring which is asymmetric to the right. Mild spinal stenosis. Mild right foraminal narrowing   L3-L4: Advanced facet degeneration with spurring and ligamentum flavum thickening. The disc is circumferentially bulging with advanced spinal stenosis. Moderate bilateral foraminal narrowing mainly from facet spurring.   L4-L5: Bulky degenerative facet spurring with anterolisthesis. The disc is mildly narrowed and bulging with fissure. Mild triangular narrowing of the thecal sac   L5-S1:Mild facet spurring.   IMPRESSION: 1. Generalized lumbar spine degeneration especially affecting the facets with hyperlordosis and anterolisthesis at L3-4 and below. 2. L3-4 advanced and compressive spinal stenosis. 3. Moderate foraminal narrowing bilaterally at L1-2 and L3-4.     Electronically Signed   By: Tiburcio Pea M.D.   On: 02/13/2023 09:45        I have personally reviewed the images and agree with the above interpretation.  Assessment and Plan: [name] is a pleasant 72 y.o.  female with constant LBP that radiates into the bilateral buttocks and into the posterior legs (right > left), occasionally it will stop at the calf and other times it will go into the foot. Pain is worse with walking, standing, and bending.   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 Known severe central stenosis L3-L4, mild/moderate central stenosis L4-L5.    MRI from 2021 shows slip at L3-L4 and L4-L5. 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 Also with moderate bilateral foraminal stenosis L1-L2 and L3-L4. Pain likely from L3-L5.    She is interested in any possible surgery options for her lumbar spine.    Treatment options discussed with patient and following plan made:    - Continue with PT at [facility]. Per patient, she has done 4 visits.  - Follow up with Dr. Myer Haff in 5-6 weeks to discuss possible surgery options. Will need to get PT notes prior to this visit.  - She will call with any concerns in the interim.   Drake Leach PA-C Neurosurgery

## 2023-02-16 ENCOUNTER — Ambulatory Visit (INDEPENDENT_AMBULATORY_CARE_PROVIDER_SITE_OTHER): Payer: Medicare Other | Admitting: Orthopedic Surgery

## 2023-02-16 ENCOUNTER — Encounter: Payer: Self-pay | Admitting: Orthopedic Surgery

## 2023-02-16 DIAGNOSIS — M47816 Spondylosis without myelopathy or radiculopathy, lumbar region: Secondary | ICD-10-CM

## 2023-02-16 DIAGNOSIS — M4316 Spondylolisthesis, lumbar region: Secondary | ICD-10-CM | POA: Diagnosis not present

## 2023-02-16 DIAGNOSIS — M4726 Other spondylosis with radiculopathy, lumbar region: Secondary | ICD-10-CM | POA: Diagnosis not present

## 2023-02-16 DIAGNOSIS — M48061 Spinal stenosis, lumbar region without neurogenic claudication: Secondary | ICD-10-CM | POA: Diagnosis not present

## 2023-02-16 DIAGNOSIS — M5416 Radiculopathy, lumbar region: Secondary | ICD-10-CM

## 2023-03-22 NOTE — Progress Notes (Unsigned)
Referring Physician:  Marisue Ivan, MD [phone] [facility] [address] [facility] [place],  [place] [address]  Primary Physician:  Marisue Ivan, MD  History of Present Illness: 03/23/2023 [name] is here today with a chief complaint of  low back pain that radiates into the bilateral buttocks and into the posterior legs, occasionally it will stop at the calf and other times it will go into the foot.   She has been having pain for at least 2 years.  She reports sharp, stabbing, and heavy discomfort made worse by walking and standing.  She walks bent forward.  She has been having constant pain in her ability to walk and maintain her activities of daily living has been doing worse over time.  Bowel/Bladder Dysfunction: none  Conservative measures:  Physical therapy: has participated in at [facility], she goes 1 time a month.  Multimodal medical therapy including regular antiinflammatories:  celebrex, cymbalta, tylenol arthritis   Injections:  has received epidural steroid injections 10/14/2022: Right L4-5 and right S1 transforaminal ESI (nearly complete relief x 3 weeks, then sustained 50% relief, dexamethasone 13 mg) 09/10/2022: Right L4-5 and right S1 transforaminal ESI (good relief x 5 days, then 30-40% relief) 07/07/2022: Right hip joint injection (resolution of groin pain) 05/19/2022: RFA to the right L3-4 and L4-5 facet joints (moderate to good relief) 04/30/2022: MBB to the right L3-4 and L4-5 facet joints (9/10 to 0/10) 04/16/2022: MBB to the right L3-4 and L4-5 facet joints (9/10 to 0/10) 11/18/2021: Right L4-5 and right S1 transforaminal ESI (moderate to good relief) 06/27/2021: Right L4-5 and right S1 transforaminal ESI (moderate to good relief) 04/10/2021: Left C4 trigger point injection (good relief) 10/14/2020: Left C4 trigger point injection (good relief) 05/13/2020: Right L4-5 and right S1 transforaminal ESI (moderate relief) 04/03/2020: Right L4-5 and  right L5-S1 transforaminal ESI (good relief of right posterior thigh and posterior calf pain, minimal improvement of right buttock pain) 02/02/2020: Right sacroiliac joint injection (mild to moderate relief) 02/18/2017: Right sacroiliac joint injection (good relief) 05/05/2016: Right sacroiliac joint injection (good relief)   Past Surgery: denies  KIIRA DOMINO has no symptoms of cervical myelopathy.  The symptoms are causing a significant impact on the patient's life.   Progress Note from Drake Leach, [place] on 02/16/23:   History of Present Illness: Emily Singleton is a 72 y.o. female has a history of GAD, obesity, vitamin D  deficiency, aortic atherosclerosis, IBS, and osteopenia.    Last seen by me on 01/29/23 for back and bilateral leg pain. MRI from 2021 showed slip at L3-L4 and L4-L5. 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    She was sent to PT at last visit and is here to review her lumbar MRI/xray results.    She has done 4 visits of PT at [facility]. No change in symptoms. She continues with constant LBP that radiates into the bilateral buttocks and into the posterior legs (right > left), occasionally it will stop at the calf and other times it will go into the foot. Pain is worse with walking, standing, and bending. Some relief with sitting. No numbness or tingling.   I have utilized the care everywhere function in epic to review the outside records available from external health systems.  Review of Systems:  A 10 point review of systems is negative, except for the pertinent positives and negatives detailed in the HPI.  Past Medical History: Past Medical History:  Diagnosis Date  DDD (degenerative disc disease), lumbar    Depression    Endometriosis    GERD (gastroesophageal reflux disease)    possible eosinophilic esophagitis on egd   IBS (irritable bowel syndrome)     Past Surgical History: Past Surgical History:  Procedure  Laterality Date   ABDOMINAL HYSTERECTOMY     partial   BREAST REDUCTION SURGERY     REDUCTION MAMMAPLASTY Bilateral     Allergies: Allergies as of 03/23/2023   (No Known Allergies)    Medications:  Current Outpatient Medications:    colestipol (COLESTID) 1 g tablet, Take 1 g by mouth daily., Disp: , Rfl:    DULoxetine (CYMBALTA) 30 MG capsule, Take 30 mg by mouth daily., Disp: , Rfl:    omeprazole (PRILOSEC) 20 MG capsule, Take 20 mg by mouth daily., Disp: , Rfl:   Social History: Social History   Tobacco Use   Smoking status: Former   Smokeless tobacco: Never  Substance Use Topics   Alcohol use: Yes    Comment: occasional   Drug use: No    Family Medical History: Family History  Problem Relation Age of Onset   Cancer Sister        colon age 58   Breast cancer Maternal Aunt 24    Physical Examination: There were no vitals filed for this visit.  General: Patient is in no apparent distress. Attention to examination is appropriate.  Neck:   Supple.  Full range of motion.  Respiratory: Patient is breathing without any difficulty.   NEUROLOGICAL:     Awake, alert, oriented to person, place, and time.  Speech is clear and fluent.   Cranial Nerves: Pupils equal round and reactive to light.  Facial tone is symmetric.  Facial sensation is symmetric. Shoulder shrug is symmetric. Tongue protrusion is midline.  There is no pronator drift.  Strength: Side Biceps Triceps Deltoid Interossei Grip Wrist Ext. Wrist Flex.  R 5 5 5 5 5 5 5   L 5 5 5 5 5 5 5    Side Iliopsoas Quads Hamstring PF DF EHL  R 5 5 5 5 5 5   L 5 5 5 5 5 5    Reflexes are 1+ and symmetric at the biceps, triceps, brachioradialis, patella and achilles.   Hoffman's is absent.   Bilateral upper and lower extremity sensation is intact to light touch.    No evidence of dysmetria noted.  Gait is antalgic and stooped.     Medical Decision Making  Imaging: MRI L spine 02/13/2023 IMPRESSION: 1.  Generalized lumbar spine degeneration especially affecting the facets with hyperlordosis and anterolisthesis at L3-4 and below. 2. L3-4 advanced and compressive spinal stenosis. 3. Moderate foraminal narrowing bilaterally at L1-2 and L3-4.     Electronically Signed   By: Tiburcio Pea M.D.   On: 02/13/2023 09:45    I have personally reviewed the images and agree with the above interpretation.  Assessment and Plan: Ms. Onnen is a pleasant 72 y.o. 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  She has neurogenic claudication from this.  She is also having back pain.  She has tried and failed conservative management.  At this point, no further conservative management is indicated.  I recommended L3-4 lateral lumbar interbody fusion with posterior fixation and fusion.  I discussed the planned procedure at length with the patient, including the risks, benefits, alternatives, and indications.  She would like to talk with her family to  determine timing.  We spent considerable time reviewing her images so that she fully understood the rationale behind the procedure.    Thank you for involving me in the care of this patient.      Kamren Heskett K. Myer Haff MD, [facility]

## 2023-03-23 ENCOUNTER — Encounter: Payer: Self-pay | Admitting: Neurosurgery

## 2023-03-23 ENCOUNTER — Ambulatory Visit (INDEPENDENT_AMBULATORY_CARE_PROVIDER_SITE_OTHER): Payer: Medicare Other | Admitting: Neurosurgery

## 2023-03-23 VITALS — BP 122/72 | Ht 65.0 in | Wt 225.0 lb

## 2023-03-23 DIAGNOSIS — M48062 Spinal stenosis, lumbar region with neurogenic claudication: Secondary | ICD-10-CM

## 2023-03-23 DIAGNOSIS — M4316 Spondylolisthesis, lumbar region: Secondary | ICD-10-CM | POA: Diagnosis not present

## 2023-03-23 NOTE — Patient Instructions (Signed)
Please see below for information in regards to your upcoming surgery:   Planned surgery: L3-4 lateral lumbar interbody fusion and posterior spinal fusion   Surgery date: (call when you are ready to pick a date - Mondays and Wednesdays) at [facility] - you will find out your arrival time the business day before your surgery.   Pre-op appointment at [facility] Pre-admit Testing: we will call you with a date/time for this. Pre-admit testing is located on the first floor of the [facility] building, [address] [facility] [address], [address]. Please bring all prescriptions in the original prescription bottles to your appointment, even if you have reviewed medications by phone with a pharmacy representative. During this appointment, they will advise you which medications you can take the morning of surgery, and which medications you will need to hold for surgery. Pre-op labs may be done at your pre-op appointment. You are not required to fast for these labs. Should you need to change your pre-op appointment, please call Pre-admit testing at [phone].    Surgical clearance: we will send a clearance form to Dr Burnadette Pop     Brace: [facility] will contact you regarding an appointment for the brace you will use after surgery. Their number is [phone] should you miss their call or have an issue with your brace after surgery. You will need to bring the brace to the hospital on the day of surgery.    NSAIDS (Non-steroidal anti-inflammatory drugs): because you are having a fusion, please avoid taking any NSAIDS (examples: ibuprofen, motrin, aleve, naproxen, meloxicam, diclofenac) for 3 months after surgery. Celebrex is an exception and is OK to take, if prescribed. Tylenol is not an NSAID.    Home health physical therapy: [facility] will contact you regarding home health physical therapy for after surgery.Their number is  [phone].   Because you are having a fusion or arthroplasty: for appointments after your 2 week follow-up: please arrive at the [facility] outpatient imaging center ([address], [employer]) or [facility] one hour prior to your appointment for x-rays. This applies to every appointment after your 2 week follow-up. Failure to do so may result in your appointment being rescheduled.    Common restrictions after surgery: No bending, lifting, or twisting ("BLT"). Avoid lifting objects heavier than 10 pounds for the first 6 weeks after surgery. Where possible, avoid household activities that involve lifting, bending, reaching, pushing, or pulling such as laundry, vacuuming, grocery shopping, and childcare. Try to arrange for help from friends and family for these activities while your back heals. Do not drive while taking prescription pain medication. Weeks 6 through 12 after surgery: avoid lifting more than 25 pounds.     How to contact us:  If you have any questions/concerns before or after surgery, you can reach [place] at [phone], or you can send a mychart message. We can be reached by phone or mychart 8am-4pm, Monday-Friday.  *Please note: Calls after 4pm are forwarded to a third party answering service. Mychart messages are not routinely monitored during evenings, weekends, and holidays. Please call our office to contact the answering service for urgent concerns during non-business hours.     If you have FMLA/disability paperwork, please drop it off or fax it to [phone], attention Patty.   Appointments/FMLA & disability paperwork: Patty & Cristin  Nurse: Royston Cowper  Medical assistants: Laurann Montana Physician Assistant's: Manning Charity & Drake Leach Surgeons: Venetia Night, MD &  Ernestine Mcmurray, MD

## 2023-03-24 ENCOUNTER — Telehealth: Payer: Self-pay | Admitting: Neurosurgery

## 2023-03-24 DIAGNOSIS — M4316 Spondylolisthesis, lumbar region: Secondary | ICD-10-CM

## 2023-03-24 DIAGNOSIS — M48062 Spinal stenosis, lumbar region with neurogenic claudication: Secondary | ICD-10-CM

## 2023-03-24 NOTE — Telephone Encounter (Signed)
Patient calling back that she would like surgery on 05/05/2023.

## 2023-03-25 NOTE — Addendum Note (Signed)
Addended by: Sharlot Gowda on: 03/25/2023 11:52 AM   Modules accepted: Orders

## 2023-03-25 NOTE — Telephone Encounter (Signed)
I spoke to the patient and confirmed her surgery is on 05/05/2023, we went over her postop's and she is aware of the xrays at her 2nd/3rd post op. We will call her back with her pre-op. She agreed.

## 2023-03-30 ENCOUNTER — Encounter: Payer: Self-pay | Admitting: Neurosurgery

## 2023-04-06 ENCOUNTER — Other Ambulatory Visit: Payer: Self-pay

## 2023-04-06 DIAGNOSIS — Z01818 Encounter for other preprocedural examination: Secondary | ICD-10-CM

## 2023-04-07 ENCOUNTER — Ambulatory Visit
Admission: RE | Admit: 2023-04-07 | Discharge: 2023-04-07 | Disposition: A | Discharge status: Home / Self Care | Payer: Medicare Other | Source: Ambulatory Visit | Attending: Orthopedic Surgery | Admitting: Orthopedic Surgery

## 2023-04-07 DIAGNOSIS — M47816 Spondylosis without myelopathy or radiculopathy, lumbar region: Secondary | ICD-10-CM | POA: Insufficient documentation

## 2023-04-07 NOTE — Telephone Encounter (Signed)
Lumbar x-rays ordered.

## 2023-04-07 NOTE — Telephone Encounter (Signed)
Pt returned my call, she stated the pain was in her back not going into her legs. She said this was a totally different pain from what she was having. She she problems bending,squatting . Per Emily Singleton she will put in the lumbar xray order. Patient stated she will go to [facility] after she gets off work today.

## 2023-04-07 NOTE — Telephone Encounter (Signed)
Called LVM to call our office back ,to get more information on her back pain .

## 2023-04-07 NOTE — Addendum Note (Signed)
Addended byDrake Leach on: 04/07/2023 10:07 AM   Modules accepted: Orders

## 2023-04-07 NOTE — Telephone Encounter (Signed)
Please call her to get more information. Is pain only in her back? Leg pain? Assuming this pain is different than her normal pain?   May need to have her get lumbar xrays, but let  me know.   Thanks.

## 2023-04-14 ENCOUNTER — Encounter: Payer: Self-pay | Admitting: Orthopedic Surgery

## 2023-04-14 NOTE — Telephone Encounter (Signed)
Lumbar xrays dated 04/07/23:  FINDINGS: Transitional thoracolumbar and lumbosacral vertebrae with 4 intervening lumbar vertebrae. Stable mild thoracolumbar rotary scoliosis. Mild anterior spur formation at multiple levels of the lumbar and lower thoracic spine. Facet degenerative changes in the mid and lower lumbar spine. Associated grade 1 anterolisthesis at the L3-4 and L4-5 levels without significant change. No pars defects or fractures.   IMPRESSION: 1. Transitional thoracolumbar and lumbosacral vertebrae. 2. Stable mild thoracolumbar rotary scoliosis. 3. Stable mild multilevel degenerative changes.     Electronically Signed   By: Beckie Salts M.D.   On: 04/13/2023 23:01  I have personally reviewed the images and agree with the above interpretation.

## 2023-04-22 ENCOUNTER — Encounter
Admission: RE | Admit: 2023-04-22 | Discharge: 2023-04-22 | Disposition: A | Discharge status: Home / Self Care | Payer: Medicare Other | Source: Ambulatory Visit | Attending: Neurosurgery | Admitting: Neurosurgery

## 2023-04-22 DIAGNOSIS — M5136 Other intervertebral disc degeneration, lumbar region: Secondary | ICD-10-CM | POA: Diagnosis not present

## 2023-04-22 DIAGNOSIS — E559 Vitamin D deficiency, unspecified: Secondary | ICD-10-CM | POA: Insufficient documentation

## 2023-04-22 DIAGNOSIS — Z01812 Encounter for preprocedural laboratory examination: Secondary | ICD-10-CM

## 2023-04-22 DIAGNOSIS — I700 Atherosclerosis of aorta: Secondary | ICD-10-CM | POA: Insufficient documentation

## 2023-04-22 DIAGNOSIS — Z01818 Encounter for other preprocedural examination: Secondary | ICD-10-CM | POA: Diagnosis present

## 2023-04-22 DIAGNOSIS — Z01810 Encounter for preprocedural cardiovascular examination: Secondary | ICD-10-CM | POA: Diagnosis not present

## 2023-04-22 HISTORY — DX: Obesity, unspecified: E66.9

## 2023-04-22 HISTORY — DX: Vitamin D deficiency, unspecified: E55.9

## 2023-04-22 HISTORY — DX: Unspecified osteoarthritis, unspecified site: M19.90

## 2023-04-22 HISTORY — DX: Generalized anxiety disorder: F41.1

## 2023-04-22 HISTORY — DX: Diverticulosis of intestine, part unspecified, without perforation or abscess without bleeding: K57.90

## 2023-04-22 HISTORY — DX: Other specified disorders of bone density and structure, unspecified site: M85.80

## 2023-04-22 HISTORY — DX: Spinal stenosis, lumbar region without neurogenic claudication: M48.061

## 2023-04-22 HISTORY — DX: Atherosclerosis of aorta: I70.0

## 2023-04-22 LAB — BASIC METABOLIC PANEL
Anion gap: 12 (ref 5–15)
BUN: 18 mg/dL (ref 8–23)
CO2: 24 mmol/L (ref 22–32)
Calcium: 9.1 mg/dL (ref 8.9–10.3)
Chloride: 101 mmol/L (ref 98–111)
Creatinine, Ser: 0.7 mg/dL (ref 0.44–1.00)
GFR, Estimated: >60 mL/min (ref >60)
Glucose, Bld: 93 mg/dL (ref 70–99)
Potassium: 4 mmol/L (ref 3.5–5.1)
Sodium: 137 mmol/L (ref 135–145)

## 2023-04-22 LAB — CBC
HCT: 43.9 % (ref 36.0–46.0)
Hemoglobin: 14.1 g/dL (ref 12.0–15.0)
MCH: 29.3 pg (ref 26.0–34.0)
MCHC: 32.1 g/dL (ref 30.0–36.0)
MCV: 91.1 fL (ref 80.0–100.0)
Platelets: 200 10*3/uL (ref 150–400)
RBC: 4.82 MIL/uL (ref 3.87–5.11)
RDW: 13.6 % (ref 11.5–15.5)
WBC: 6.1 10*3/uL (ref 4.0–10.5)
nRBC: 0 % (ref 0.0–0.2)

## 2023-04-22 LAB — URINALYSIS, ROUTINE W REFLEX MICROSCOPIC
Bilirubin Urine: NEGATIVE
Glucose, UA: NEGATIVE mg/dL
Hgb urine dipstick: NEGATIVE
Ketones, ur: NEGATIVE mg/dL
Leukocytes,Ua: NEGATIVE
Nitrite: NEGATIVE
Protein, ur: NEGATIVE mg/dL
Specific Gravity, Urine: 1.021 (ref 1.005–1.030)
pH: 5 (ref 5.0–8.0)

## 2023-04-22 LAB — SURGICAL PCR SCREEN
MRSA, PCR: NEGATIVE
Staphylococcus aureus: NEGATIVE

## 2023-04-22 LAB — TYPE AND SCREEN
ABO/RH(D): O NEG
Antibody Screen: NEGATIVE

## 2023-04-22 NOTE — Patient Instructions (Addendum)
Your procedure is scheduled on: Wednesday, August 21 Report to the Registration Desk on the 1st floor of the [facility] To find out your arrival time, please call [phone] between 1PM - 3PM on: Tuesday, August 20 If your arrival time is 6:00 am, do not arrive before that time as the [facility] entrance doors do not open until 6:00 am.  REMEMBER: Instructions that are not followed completely may result in serious medical risk, up to and including death; or upon the discretion of your surgeon and anesthesiologist your surgery may need to be rescheduled.  Do not eat food after midnight the night before surgery.  No gum chewing or hard candies.  You may however, drink CLEAR liquids up to 2 hours before you are scheduled to arrive for your surgery. Do not drink anything within 2 hours of your scheduled arrival time.  Clear liquids include: - water  - apple juice without pulp - gatorade (not RED colors) - black coffee or tea (Do NOT add milk or creamers to the coffee or tea) Do NOT drink anything that is not on this list.  One week prior to surgery: starting August 14 Stop ANY OVER THE COUNTER supplements until after surgery. You may however, continue to take Tylenol if needed for pain up until the day of surgery.  Continue taking all prescribed medications   TAKE ONLY THESE MEDICATIONS THE MORNING OF SURGERY WITH A SIP OF WATER:  Omeprazole (Prilosec)   No Alcohol for 24 hours before or after surgery.  No Smoking including e-cigarettes for 24 hours before surgery.  No chewable tobacco products for at least 6 hours before surgery.  No nicotine patches on the day of surgery.  Do not use any "recreational" drugs for at least a week (preferably 2 weeks) before your surgery.  Please be advised that the combination of cocaine and anesthesia may have negative outcomes, up to and including death. If you test positive for cocaine, your surgery will be cancelled.  On the morning  of surgery brush your teeth with toothpaste and water, you may rinse your mouth with mouthwash if you wish. Do not swallow any toothpaste or mouthwash.  Use CHG Soap as directed on instruction sheet.  Do not wear jewelry, make-up, hairpins, clips or nail polish.  Do not wear lotions, powders, or perfumes.   Do not shave body hair from the neck down 48 hours before surgery.  Contact lenses, hearing aids and dentures may not be worn into surgery.  Do not bring valuables to the hospital. [facility] is not responsible for any missing/lost belongings or valuables.   Notify your doctor if there is any change in your medical condition (cold, fever, infection).  Wear comfortable clothing (specific to your surgery type) to the hospital.  After surgery, you can help prevent lung complications by doing breathing exercises.  Take deep breaths and cough every 1-2 hours.  If you are being admitted to the hospital overnight, leave your suitcase in the car. After surgery it may be brought to your room.  In case of increased patient census, it may be necessary for you, the patient, to continue your postoperative care in the Same Day Surgery department.  If you are being discharged the day of surgery, you will not be allowed to drive home. You will need a responsible individual to drive you home and stay with you for 24 hours after surgery.   If you are taking public transportation, you will need to have  a responsible individual with you.  Please call the Pre-admissions Testing Dept. at [phone] if you have any questions about these instructions.  Surgery Visitation Policy:  Patients having surgery or a procedure may have two visitors.  Children under the age of 25 must have an adult with them who is not the patient.  Inpatient Visitation:    Visiting hours are 7 a.m. to 8 p.m. Up to four visitors are allowed at one time in a patient room. The visitors may rotate out with other people  during the day.  One visitor age 71 or older may stay with the patient overnight and must be in the room by 8 p.m.      Pre-operative 5 CHG Bath Instructions   You can play a key role in reducing the risk of infection after surgery. Your skin needs to be as free of germs as possible. You can reduce the number of germs on your skin by washing with CHG (chlorhexidine gluconate) soap before surgery. CHG is an antiseptic soap that kills germs and continues to kill germs even after washing.   DO NOT use if you have an allergy to chlorhexidine/CHG or antibacterial soaps. If your skin becomes reddened or irritated, stop using the CHG and notify one of our RNs at [phone].   Please shower with the CHG soap starting 4 days before surgery using the following schedule:     Please keep in mind the following:  DO NOT shave, including legs and underarms, starting the day of your first shower.   You may shave your face at any point before/day of surgery.  Place clean sheets on your bed the day you start using CHG soap. Use a clean washcloth (not used since being washed) for each shower. DO NOT sleep with pets once you start using the CHG.   CHG Shower Instructions:  If you choose to wash your hair and private area, wash first with your normal shampoo/soap.  After you use shampoo/soap, rinse your hair and body thoroughly to remove shampoo/soap residue.  Turn the water OFF and apply about 3 tablespoons (45 ml) of CHG soap to a CLEAN washcloth.  Apply CHG soap ONLY FROM YOUR NECK DOWN TO YOUR TOES (washing for 3-5 minutes)  DO NOT use CHG soap on face, private areas, open wounds, or sores.  Pay special attention to the area where your surgery is being performed.  If you are having back surgery, having someone wash your back for you may be helpful. Wait 2 minutes after CHG soap is applied, then you may rinse off the CHG soap.  Pat dry with a clean towel  Put on clean clothes/pajamas   If you  choose to wear lotion, please use ONLY the CHG-compatible lotions on the back of this paper.     Additional instructions for the day of surgery: DO NOT APPLY any lotions, deodorants, cologne, or perfumes.   Put on clean/comfortable clothes.  Brush your teeth.  Ask your nurse before applying any prescription medications to the skin.      CHG Compatible Lotions   Aveeno Moisturizing lotion  Cetaphil Moisturizing Cream  Cetaphil Moisturizing Lotion  Clairol Herbal Essence Moisturizing Lotion, Dry Skin  Clairol Herbal Essence Moisturizing Lotion, Extra Dry Skin  Clairol Herbal Essence Moisturizing Lotion, Normal Skin  Curel Age Defying Therapeutic Moisturizing Lotion with Alpha Hydroxy  Curel Extreme Care Body Lotion  Curel Soothing Hands Moisturizing Hand Lotion  Curel Therapeutic Moisturizing Cream, Fragrance-Free  Curel Therapeutic  Moisturizing Lotion, Fragrance-Free  Curel Therapeutic Moisturizing Lotion, Original Formula  Eucerin Daily Replenishing Lotion  Eucerin Dry Skin Therapy Plus Alpha Hydroxy Crme  Eucerin Dry Skin Therapy Plus Alpha Hydroxy Lotion  Eucerin Original Crme  Eucerin Original Lotion  Eucerin Plus Crme Eucerin Plus Lotion  Eucerin TriLipid Replenishing Lotion  Keri Anti-Bacterial Hand Lotion  Keri Deep Conditioning Original Lotion Dry Skin Formula Softly Scented  Keri Deep Conditioning Original Lotion, Fragrance Free Sensitive Skin Formula  Keri Lotion Fast Absorbing Fragrance Free Sensitive Skin Formula  Keri Lotion Fast Absorbing Softly Scented Dry Skin Formula  Keri Original Lotion  Keri Skin Renewal Lotion Keri Silky Smooth Lotion  Keri Silky Smooth Sensitive Skin Lotion  Nivea Body Creamy Conditioning Oil  Nivea Body Extra Enriched Teacher, adult education Moisturizing Lotion Nivea Crme  Nivea Skin Firming Lotion  NutraDerm 30 Skin Lotion  NutraDerm Skin Lotion  NutraDerm Therapeutic Skin Cream  NutraDerm  Therapeutic Skin Lotion  ProShield Protective Hand Cream  Provon moisturizing lotion

## 2023-04-23 NOTE — Telephone Encounter (Signed)
Good morning Emily Singleton  I have contacted [facility] regarding your brace, I have re fax your order to [facility] and they have received them and should be reaching out to you shortly for an appointment . If any questions, please give our office a call   Augusto Gamble [facility] at [facility] of Dr Venetia Night, Dr Ernestine Mcmurray, Manning Charity, PA-C, [place], [place] Ph: [phone] Fax: [phone]     ** Please note that MyChart messages are not monitored during evenings, weekends, and holidays. We can be reached by phone 8am-4pm, Monday-Friday. Please call our office [phone]) to contact the answering service for urgent concerns during non-business hours.

## 2023-05-05 ENCOUNTER — Encounter
Admission: RE | Disposition: A | Discharge status: Home Health | Payer: Self-pay | Source: Ambulatory Visit | Attending: Neurosurgery

## 2023-05-05 ENCOUNTER — Other Ambulatory Visit: Payer: Self-pay

## 2023-05-05 ENCOUNTER — Inpatient Hospital Stay
Admission: RE | Admit: 2023-05-05 | Discharge: 2023-05-07 | DRG: 455 | Disposition: A | Discharge status: Home Health | Payer: Medicare Other | Source: Ambulatory Visit | Attending: Neurosurgery | Admitting: Neurosurgery

## 2023-05-05 ENCOUNTER — Inpatient Hospital Stay: Payer: Medicare Other

## 2023-05-05 ENCOUNTER — Inpatient Hospital Stay: Payer: Medicare Other | Admitting: Urgent Care

## 2023-05-05 ENCOUNTER — Encounter: Payer: Self-pay | Admitting: Neurosurgery

## 2023-05-05 ENCOUNTER — Inpatient Hospital Stay: Payer: Medicare Other | Admitting: Registered Nurse

## 2023-05-05 DIAGNOSIS — Z800 Family history of malignant neoplasm of digestive organs: Secondary | ICD-10-CM

## 2023-05-05 DIAGNOSIS — Z8719 Personal history of other diseases of the digestive system: Secondary | ICD-10-CM | POA: Diagnosis not present

## 2023-05-05 DIAGNOSIS — E559 Vitamin D deficiency, unspecified: Secondary | ICD-10-CM | POA: Diagnosis present

## 2023-05-05 DIAGNOSIS — Z6836 Body mass index (BMI) 36.0-36.9, adult: Secondary | ICD-10-CM | POA: Diagnosis not present

## 2023-05-05 DIAGNOSIS — F411 Generalized anxiety disorder: Secondary | ICD-10-CM | POA: Diagnosis present

## 2023-05-05 DIAGNOSIS — K589 Irritable bowel syndrome without diarrhea: Secondary | ICD-10-CM | POA: Diagnosis present

## 2023-05-05 DIAGNOSIS — M5136 Other intervertebral disc degeneration, lumbar region: Secondary | ICD-10-CM | POA: Diagnosis present

## 2023-05-05 DIAGNOSIS — Z79899 Other long term (current) drug therapy: Secondary | ICD-10-CM | POA: Diagnosis not present

## 2023-05-05 DIAGNOSIS — Z9089 Acquired absence of other organs: Secondary | ICD-10-CM | POA: Diagnosis not present

## 2023-05-05 DIAGNOSIS — M8580 Other specified disorders of bone density and structure, unspecified site: Secondary | ICD-10-CM | POA: Diagnosis present

## 2023-05-05 DIAGNOSIS — E669 Obesity, unspecified: Secondary | ICD-10-CM | POA: Diagnosis present

## 2023-05-05 DIAGNOSIS — I700 Atherosclerosis of aorta: Secondary | ICD-10-CM | POA: Diagnosis present

## 2023-05-05 DIAGNOSIS — Z87898 Personal history of other specified conditions: Secondary | ICD-10-CM | POA: Diagnosis not present

## 2023-05-05 DIAGNOSIS — Z90710 Acquired absence of both cervix and uterus: Secondary | ICD-10-CM

## 2023-05-05 DIAGNOSIS — M1990 Unspecified osteoarthritis, unspecified site: Secondary | ICD-10-CM | POA: Diagnosis present

## 2023-05-05 DIAGNOSIS — M48062 Spinal stenosis, lumbar region with neurogenic claudication: Secondary | ICD-10-CM | POA: Diagnosis present

## 2023-05-05 DIAGNOSIS — Z981 Arthrodesis status: Secondary | ICD-10-CM | POA: Diagnosis present

## 2023-05-05 DIAGNOSIS — K219 Gastro-esophageal reflux disease without esophagitis: Secondary | ICD-10-CM | POA: Diagnosis present

## 2023-05-05 DIAGNOSIS — M4316 Spondylolisthesis, lumbar region: Secondary | ICD-10-CM | POA: Diagnosis present

## 2023-05-05 DIAGNOSIS — Z9889 Other specified postprocedural states: Secondary | ICD-10-CM | POA: Diagnosis not present

## 2023-05-05 DIAGNOSIS — Z87891 Personal history of nicotine dependence: Secondary | ICD-10-CM | POA: Diagnosis not present

## 2023-05-05 DIAGNOSIS — Z803 Family history of malignant neoplasm of breast: Secondary | ICD-10-CM | POA: Diagnosis not present

## 2023-05-05 DIAGNOSIS — F32A Depression, unspecified: Secondary | ICD-10-CM | POA: Diagnosis present

## 2023-05-05 DIAGNOSIS — Z01818 Encounter for other preprocedural examination: Secondary | ICD-10-CM

## 2023-05-05 DIAGNOSIS — Z01812 Encounter for preprocedural laboratory examination: Principal | ICD-10-CM

## 2023-05-05 HISTORY — PX: APPLICATION OF INTRAOPERATIVE CT SCAN: SHX6668

## 2023-05-05 HISTORY — PX: ANTERIOR LATERAL LUMBAR FUSION WITH PERCUTANEOUS SCREW 1 LEVEL: SHX5553

## 2023-05-05 LAB — ABO/RH: ABO/RH(D): O NEG

## 2023-05-05 SURGERY — ANTERIOR LATERAL LUMBAR FUSION WITH PERCUTANEOUS SCREW 1 LEVEL
Anesthesia: General

## 2023-05-05 MED ORDER — SODIUM CHLORIDE 0.9 % IV SOLN
INTRAVENOUS | Status: DC | PRN
  Administered 2023-05-05: .15 ug/kg/min via INTRAVENOUS

## 2023-05-05 MED ORDER — GLYCOPYRROLATE 0.2 MG/ML IJ SOLN
INTRAMUSCULAR | Status: DC | PRN
  Administered 2023-05-05: .2 mg via INTRAVENOUS

## 2023-05-05 MED ORDER — SODIUM CHLORIDE 0.9% FLUSH
3.0000 mL | Freq: Two times a day (BID) | INTRAVENOUS | Status: DC
  Administered 2023-05-05 – 2023-05-06 (×2): 3 mL via INTRAVENOUS

## 2023-05-05 MED ORDER — DULOXETINE HCL 30 MG PO CPEP
30.0000 mg | ORAL_CAPSULE | Freq: Every day | ORAL | Status: DC
  Administered 2023-05-05 – 2023-05-06 (×2): 30 mg via ORAL
  Filled 2023-05-05 (×2): qty 1

## 2023-05-05 MED ORDER — CEFAZOLIN IN SODIUM CHLORIDE 2-0.9 GM/100ML-% IV SOLN
2.0000 g | Freq: Once | INTRAVENOUS | Status: AC
  Administered 2023-05-05: 2 g via INTRAVENOUS

## 2023-05-05 MED ORDER — FENTANYL CITRATE (PF) 100 MCG/2ML IJ SOLN
INTRAMUSCULAR | Status: AC
  Filled 2023-05-05: qty 2

## 2023-05-05 MED ORDER — ENOXAPARIN SODIUM 40 MG/0.4ML IJ SOSY
40.0000 mg | PREFILLED_SYRINGE | INTRAMUSCULAR | Status: DC
  Administered 2023-05-06 – 2023-05-07 (×2): 40 mg via SUBCUTANEOUS

## 2023-05-05 MED ORDER — PHENYLEPHRINE HCL-NACL 20-0.9 MG/250ML-% IV SOLN
INTRAVENOUS | Status: AC
  Filled 2023-05-05: qty 250

## 2023-05-05 MED ORDER — SODIUM CHLORIDE 0.9 % IV SOLN: INTRAVENOUS | Status: DC

## 2023-05-05 MED ORDER — CEFAZOLIN SODIUM-DEXTROSE 2-4 GM/100ML-% IV SOLN
INTRAVENOUS | Status: AC
  Filled 2023-05-05: qty 100

## 2023-05-05 MED ORDER — METHOCARBAMOL 500 MG PO TABS
500.0000 mg | ORAL_TABLET | Freq: Four times a day (QID) | ORAL | Status: DC | PRN
  Administered 2023-05-06: 500 mg via ORAL

## 2023-05-05 MED ORDER — MENTHOL 3 MG MT LOZG: 1.0000 | LOZENGE | OROMUCOSAL | Status: DC | PRN

## 2023-05-05 MED ORDER — SENNA 8.6 MG PO TABS
1.0000 | ORAL_TABLET | Freq: Two times a day (BID) | ORAL | Status: DC
  Administered 2023-05-05 – 2023-05-06 (×2): 8.6 mg via ORAL

## 2023-05-05 MED ORDER — VANCOMYCIN HCL IN DEXTROSE 1-5 GM/200ML-% IV SOLN
1000.0000 mg | Freq: Once | INTRAVENOUS | Status: AC
  Administered 2023-05-05 (×2): 1000 mg via INTRAVENOUS

## 2023-05-05 MED ORDER — CHLORHEXIDINE GLUCONATE 0.12 % MT SOLN
OROMUCOSAL | Status: AC
  Filled 2023-05-05: qty 15

## 2023-05-05 MED ORDER — ROCURONIUM BROMIDE 100 MG/10ML IV SOLN
INTRAVENOUS | Status: DC | PRN
Start: 2023-05-05 — End: 2023-05-05
  Administered 2023-05-05 (×3): 20 mg via INTRAVENOUS

## 2023-05-05 MED ORDER — ONDANSETRON HCL 4 MG PO TABS: 4.0000 mg | ORAL_TABLET | Freq: Four times a day (QID) | ORAL | Status: DC | PRN

## 2023-05-05 MED ORDER — SENNA 8.6 MG PO TABS
ORAL_TABLET | ORAL | Status: AC
  Filled 2023-05-05: qty 1

## 2023-05-05 MED ORDER — OXYCODONE HCL 5 MG PO TABS
5.0000 mg | ORAL_TABLET | Freq: Once | ORAL | Status: AC | PRN
  Administered 2023-05-05: 5 mg via ORAL

## 2023-05-05 MED ORDER — ORAL CARE MOUTH RINSE: 15.0000 mL | Freq: Once | OROMUCOSAL | Status: AC

## 2023-05-05 MED ORDER — SODIUM CHLORIDE (PF) 0.9 % IJ SOLN
INTRAMUSCULAR | Status: AC
  Filled 2023-05-05: qty 20

## 2023-05-05 MED ORDER — REMIFENTANIL HCL 1 MG IV SOLR
INTRAVENOUS | Status: AC
  Filled 2023-05-05: qty 1000

## 2023-05-05 MED ORDER — ONDANSETRON HCL 4 MG/2ML IJ SOLN
INTRAMUSCULAR | Status: DC | PRN
Start: 2023-05-05 — End: 2023-05-05
  Administered 2023-05-05 (×2): 4 mg via INTRAVENOUS

## 2023-05-05 MED ORDER — MAGNESIUM CITRATE PO SOLN: 1.0000 | Freq: Once | ORAL | Status: DC | PRN

## 2023-05-05 MED ORDER — COLESTIPOL HCL 1 G PO TABS
1.0000 g | ORAL_TABLET | Freq: Every day | ORAL | Status: DC
  Administered 2023-05-05 – 2023-05-07 (×3): 1 g via ORAL
  Filled 2023-05-05 (×3): qty 1

## 2023-05-05 MED ORDER — OXYCODONE HCL 5 MG PO TABS
ORAL_TABLET | ORAL | Status: AC
  Filled 2023-05-05: qty 1

## 2023-05-05 MED ORDER — VANCOMYCIN HCL IN DEXTROSE 1-5 GM/200ML-% IV SOLN
INTRAVENOUS | Status: AC
  Filled 2023-05-05: qty 200

## 2023-05-05 MED ORDER — OXYCODONE HCL 5 MG PO TABS
5.0000 mg | ORAL_TABLET | ORAL | Status: DC | PRN
  Administered 2023-05-05 – 2023-05-07 (×2): 5 mg via ORAL

## 2023-05-05 MED ORDER — POLYETHYLENE GLYCOL 3350 17 G PO PACK: 17.0000 g | PACK | Freq: Every day | ORAL | Status: DC | PRN

## 2023-05-05 MED ORDER — PROMETHAZINE HCL 25 MG/ML IJ SOLN: 6.2500 mg | INTRAMUSCULAR | Status: DC | PRN

## 2023-05-05 MED ORDER — MORPHINE SULFATE (PF) 4 MG/ML IV SOLN: 1.0000 mg | INTRAVENOUS | Status: AC | PRN

## 2023-05-05 MED ORDER — SODIUM CHLORIDE (PF) 0.9 % IJ SOLN
INTRAMUSCULAR | Status: DC | PRN
  Administered 2023-05-05: 60 mL

## 2023-05-05 MED ORDER — PROPOFOL 1000 MG/100ML IV EMUL
INTRAVENOUS | Status: AC
  Filled 2023-05-05: qty 100

## 2023-05-05 MED ORDER — CHLORHEXIDINE GLUCONATE 0.12 % MT SOLN
15.0000 mL | Freq: Once | OROMUCOSAL | Status: AC
  Administered 2023-05-05: 15 mL via OROMUCOSAL

## 2023-05-05 MED ORDER — SURGIFLO WITH THROMBIN (HEMOSTATIC MATRIX KIT) OPTIME
TOPICAL | Status: DC | PRN
  Administered 2023-05-05: 1 via TOPICAL

## 2023-05-05 MED ORDER — PANTOPRAZOLE SODIUM 40 MG PO TBEC
40.0000 mg | DELAYED_RELEASE_TABLET | Freq: Every day | ORAL | Status: DC
  Administered 2023-05-06 – 2023-05-07 (×2): 40 mg via ORAL

## 2023-05-05 MED ORDER — FENTANYL CITRATE (PF) 100 MCG/2ML IJ SOLN
INTRAMUSCULAR | Status: DC | PRN
  Administered 2023-05-05 (×4): 50 ug via INTRAVENOUS

## 2023-05-05 MED ORDER — LIDOCAINE HCL (CARDIAC) PF 100 MG/5ML IV SOSY
PREFILLED_SYRINGE | INTRAVENOUS | Status: DC | PRN
  Administered 2023-05-05: 100 mg via INTRAVENOUS

## 2023-05-05 MED ORDER — FENTANYL CITRATE (PF) 100 MCG/2ML IJ SOLN
25.0000 ug | INTRAMUSCULAR | Status: DC | PRN
  Administered 2023-05-05 (×3): 25 ug via INTRAVENOUS

## 2023-05-05 MED ORDER — ACETAMINOPHEN 500 MG PO TABS
ORAL_TABLET | ORAL | Status: AC
  Filled 2023-05-05: qty 2

## 2023-05-05 MED ORDER — EPHEDRINE SULFATE (PRESSORS) 50 MG/ML IJ SOLN
INTRAMUSCULAR | Status: DC | PRN
  Administered 2023-05-05 (×2): 10 mg via INTRAVENOUS
  Administered 2023-05-05: 5 mg via INTRAVENOUS

## 2023-05-05 MED ORDER — DROPERIDOL 2.5 MG/ML IJ SOLN: 0.6250 mg | Freq: Once | INTRAMUSCULAR | Status: DC | PRN

## 2023-05-05 MED ORDER — 0.9 % SODIUM CHLORIDE (POUR BTL) OPTIME
TOPICAL | Status: DC | PRN
  Administered 2023-05-05: 750 mL

## 2023-05-05 MED ORDER — HYDROMORPHONE HCL 1 MG/ML IJ SOLN
INTRAMUSCULAR | Status: AC
  Filled 2023-05-05: qty 1

## 2023-05-05 MED ORDER — OXYCODONE HCL 5 MG/5ML PO SOLN: 5.0000 mg | Freq: Once | ORAL | Status: AC | PRN

## 2023-05-05 MED ORDER — SODIUM CHLORIDE FLUSH 0.9 % IV SOLN
INTRAVENOUS | Status: AC
  Filled 2023-05-05: qty 20

## 2023-05-05 MED ORDER — BUPIVACAINE-EPINEPHRINE 0.5% -1:200000 IJ SOLN
INTRAMUSCULAR | Status: DC | PRN
  Administered 2023-05-05: 13 mL

## 2023-05-05 MED ORDER — KETOROLAC TROMETHAMINE 15 MG/ML IJ SOLN
7.5000 mg | Freq: Four times a day (QID) | INTRAMUSCULAR | Status: AC
  Administered 2023-05-05 – 2023-05-06 (×3): 7.5 mg via INTRAVENOUS

## 2023-05-05 MED ORDER — BUPIVACAINE LIPOSOME 1.3 % IJ SUSP
INTRAMUSCULAR | Status: AC
  Filled 2023-05-05: qty 20

## 2023-05-05 MED ORDER — ACETAMINOPHEN 500 MG PO TABS
1000.0000 mg | ORAL_TABLET | Freq: Four times a day (QID) | ORAL | Status: DC
  Administered 2023-05-05 – 2023-05-07 (×5): 1000 mg via ORAL

## 2023-05-05 MED ORDER — ONDANSETRON HCL 4 MG/2ML IJ SOLN: 4.0000 mg | Freq: Four times a day (QID) | INTRAMUSCULAR | Status: DC | PRN

## 2023-05-05 MED ORDER — DOCUSATE SODIUM 100 MG PO CAPS
ORAL_CAPSULE | ORAL | Status: AC
  Filled 2023-05-05: qty 1

## 2023-05-05 MED ORDER — PHENYLEPHRINE HCL-NACL 20-0.9 MG/250ML-% IV SOLN
INTRAVENOUS | Status: DC | PRN
Start: 2023-05-05 — End: 2023-05-05
  Administered 2023-05-05: 20 ug/min via INTRAVENOUS

## 2023-05-05 MED ORDER — SODIUM CHLORIDE 0.9% FLUSH: 3.0000 mL | INTRAVENOUS | Status: DC | PRN

## 2023-05-05 MED ORDER — METHOCARBAMOL 1000 MG/10ML IJ SOLN
500.0000 mg | Freq: Four times a day (QID) | INTRAVENOUS | Status: DC | PRN
  Administered 2023-05-05: 500 mg via INTRAVENOUS
  Filled 2023-05-05: qty 500
  Filled 2023-05-05: qty 5

## 2023-05-05 MED ORDER — PHENOL 1.4 % MT LIQD: 1.0000 | OROMUCOSAL | Status: DC | PRN

## 2023-05-05 MED ORDER — HYDROMORPHONE HCL 1 MG/ML IJ SOLN
0.5000 mg | INTRAMUSCULAR | Status: DC | PRN
  Administered 2023-05-05: 0.5 mg via INTRAVENOUS

## 2023-05-05 MED ORDER — PROPOFOL 10 MG/ML IV BOLUS
INTRAVENOUS | Status: DC | PRN
  Administered 2023-05-05: 150 mg via INTRAVENOUS
  Administered 2023-05-05: 50 mg via INTRAVENOUS

## 2023-05-05 MED ORDER — DEXAMETHASONE SODIUM PHOSPHATE 10 MG/ML IJ SOLN
INTRAMUSCULAR | Status: DC | PRN
  Administered 2023-05-05: 10 mg via INTRAVENOUS

## 2023-05-05 MED ORDER — OXYCODONE HCL 5 MG PO TABS
10.0000 mg | ORAL_TABLET | ORAL | Status: DC | PRN
  Administered 2023-05-06 (×4): 10 mg via ORAL

## 2023-05-05 MED ORDER — ACETAMINOPHEN 500 MG PO TABS
1000.0000 mg | ORAL_TABLET | Freq: Once | ORAL | Status: AC
  Administered 2023-05-05: 1000 mg via ORAL

## 2023-05-05 MED ORDER — ACETAMINOPHEN 10 MG/ML IV SOLN: 1000.0000 mg | Freq: Once | INTRAVENOUS | Status: DC | PRN

## 2023-05-05 MED ORDER — MIDAZOLAM HCL 2 MG/2ML IJ SOLN
INTRAMUSCULAR | Status: AC
  Filled 2023-05-05: qty 2

## 2023-05-05 MED ORDER — SUCCINYLCHOLINE CHLORIDE 200 MG/10ML IV SOSY
PREFILLED_SYRINGE | INTRAVENOUS | Status: DC | PRN
  Administered 2023-05-05: 100 mg via INTRAVENOUS

## 2023-05-05 MED ORDER — LACTATED RINGERS IV SOLN: INTRAVENOUS | Status: DC

## 2023-05-05 MED ORDER — BISACODYL 10 MG RE SUPP: 10.0000 mg | Freq: Every day | RECTAL | Status: DC | PRN

## 2023-05-05 MED ORDER — SODIUM CHLORIDE 0.9 % IV SOLN: 250.0000 mL | INTRAVENOUS | Status: DC

## 2023-05-05 MED ORDER — KETOROLAC TROMETHAMINE 15 MG/ML IJ SOLN
INTRAMUSCULAR | Status: AC
  Filled 2023-05-05: qty 1

## 2023-05-05 MED ORDER — MIDAZOLAM HCL 2 MG/2ML IJ SOLN
INTRAMUSCULAR | Status: DC | PRN
  Administered 2023-05-05: 2 mg via INTRAVENOUS

## 2023-05-05 MED ORDER — BUPIVACAINE HCL (PF) 0.5 % IJ SOLN
INTRAMUSCULAR | Status: AC
  Filled 2023-05-05: qty 60

## 2023-05-05 MED ORDER — DOCUSATE SODIUM 100 MG PO CAPS
100.0000 mg | ORAL_CAPSULE | Freq: Two times a day (BID) | ORAL | Status: DC
  Administered 2023-05-05 – 2023-05-07 (×3): 100 mg via ORAL

## 2023-05-05 MED ORDER — PROPOFOL 500 MG/50ML IV EMUL
INTRAVENOUS | Status: DC | PRN
Start: 2023-05-05 — End: 2023-05-05
  Administered 2023-05-05: 125 ug/kg/min via INTRAVENOUS

## 2023-05-05 SURGICAL SUPPLY — 77 items
ADH SKN CLS APL DERMABOND .7 (GAUZE/BANDAGES/DRESSINGS) ×2
AGENT HMST KT MTR STRL THRMB (HEMOSTASIS) ×1
ALLOGRAFT BONESTRIP KORE 2.5X5 (Bone Implant) IMPLANT
APL PRP STRL LF DISP 70% ISPRP (MISCELLANEOUS) ×2
BASIN KIT SINGLE STR (MISCELLANEOUS) ×1 IMPLANT
CHLORAPREP W/TINT 26 (MISCELLANEOUS) ×2 IMPLANT
CORD BIP STRL DISP 12FT (MISCELLANEOUS) ×1 IMPLANT
CORD LIGHT LATERIAL X LIFT (MISCELLANEOUS) IMPLANT
COVERAGE SUPP BRAINLAB NG SPNE (MISCELLANEOUS) IMPLANT
COVERAGE SUPPORT SPINE BRAINLB (MISCELLANEOUS)
DERMABOND ADVANCED .7 DNX12 (GAUZE/BANDAGES/DRESSINGS) ×3 IMPLANT
DRAPE C ARM PK CFD 31 SPINE (DRAPES) ×1 IMPLANT
DRAPE C-ARMOR (DRAPES) IMPLANT
DRAPE INCISE IOBAN 66X45 STRL (DRAPES) ×1 IMPLANT
DRAPE LAPAROTOMY 100X77 ABD (DRAPES) ×2 IMPLANT
DRAPE POUCH INSTRU U-SHP 10X18 (DRAPES) ×1 IMPLANT
DRAPE SCAN PATIENT (DRAPES) ×1 IMPLANT
DRAPE TABLE BACK 80X90 (DRAPES) ×1 IMPLANT
DRSG OPSITE POSTOP 3X4 (GAUZE/BANDAGES/DRESSINGS) IMPLANT
DRSG OPSITE POSTOP 4X6 (GAUZE/BANDAGES/DRESSINGS) IMPLANT
ELECT REM PT RETURN 9FT ADLT (ELECTROSURGICAL) ×2
ELECTRODE REM PT RTRN 9FT ADLT (ELECTROSURGICAL) ×2 IMPLANT
EX-PIN ORTHOLOCK NAV 4X150 (PIN) ×1 IMPLANT
FEE CVG SUPP BRAINLAB NG SPNE (MISCELLANEOUS) IMPLANT
FEE INTRAOP CADWELL SUPPLY NCS (MISCELLANEOUS) ×1 IMPLANT
FEE INTRAOP MONITOR IMPULS NCS (MISCELLANEOUS) ×1 IMPLANT
FORCEPS BPLR BAYO 10IN 1.0TIP (ORTHOPEDIC DISPOSABLE SUPPLIES) IMPLANT
GAUZE 4X4 16PLY ~~LOC~~+RFID DBL (SPONGE) IMPLANT
GLOVE BIOGEL PI IND STRL 6.5 (GLOVE) ×3 IMPLANT
GLOVE SURG SYN 6.5 ES PF (GLOVE) ×5 IMPLANT
GLOVE SURG SYN 6.5 PF PI (GLOVE) ×5 IMPLANT
GLOVE SURG SYN 8.5 E (GLOVE) ×6 IMPLANT
GLOVE SURG SYN 8.5 PF PI (GLOVE) ×6 IMPLANT
GOWN SRG LRG LVL 4 IMPRV REINF (GOWNS) ×2 IMPLANT
GOWN SRG XL LVL 3 NONREINFORCE (GOWNS) ×2 IMPLANT
GOWN STRL NON-REIN TWL XL LVL3 (GOWNS) ×2
GOWN STRL REIN LRG LVL4 (GOWNS) ×2
GUIDEWIRE NITINOL BEVEL TIP (WIRE) IMPLANT
HOLDER FOLEY CATH W/STRAP (MISCELLANEOUS) ×1 IMPLANT
INTRAOP CADWELL SUPPLY FEE NCS (MISCELLANEOUS) ×1
INTRAOP DISP SUPPLY FEE NCS (MISCELLANEOUS) ×1
INTRAOP MONITOR FEE IMPULS NCS (MISCELLANEOUS) ×1
KIT DILATOR XLIF 5 (KITS) IMPLANT
KIT DISP MARS 3V (KITS) IMPLANT
KIT SPINAL PRONEVIEW (KITS) ×1 IMPLANT
KIT TURNOVER KIT A (KITS) ×1 IMPLANT
KNIFE BAYONET SHORT DISCETOMY (MISCELLANEOUS) IMPLANT
MANIFOLD NEPTUNE II (INSTRUMENTS) ×2 IMPLANT
MARKER SKIN DUAL TIP RULER LAB (MISCELLANEOUS) ×2 IMPLANT
MARKER SPHERE PSV REFLC 13MM (MARKER) ×7 IMPLANT
MODULE NVM5 NEXT GEN EMG (NEUROSURGERY SUPPLIES) IMPLANT
NDL SAFETY ECLIP 18X1.5 (MISCELLANEOUS) ×1 IMPLANT
PACK LAMINECTOMY ARMC (PACKS) ×1 IMPLANT
PAD ARMBOARD 7.5X6 YLW CONV (MISCELLANEOUS) ×1 IMPLANT
PENCIL SMOKE EVACUATOR (MISCELLANEOUS) ×1 IMPLANT
ROD RELINE MAS LORD 5.5X50 (Rod) IMPLANT
ROD RELINE MAS TI LORD 5.5X40 (Rod) IMPLANT
SCREW LOCK RELINE 5.5 TULIP (Screw) IMPLANT
SCREW RELINE RED 6.5X45MM POLY (Screw) IMPLANT
SCREW RELINE RED 6.5X50MM POLY (Screw) IMPLANT
SOLUTION IRRIG SURGIPHOR (IV SOLUTION) ×1 IMPLANT
SPACER HEDRON 18X55X13 10D (Spacer) IMPLANT
STAPLER SKIN PROX 35W (STAPLE) IMPLANT
SURGIFLO W/THROMBIN 8M KIT (HEMOSTASIS) ×1 IMPLANT
SUT DVC VLOC 3-0 CL 6 P-12 (SUTURE) ×1 IMPLANT
SUT ETHILON 3-0 FS-10 30 BLK (SUTURE)
SUT VIC AB 0 CT1 27 (SUTURE) ×1
SUT VIC AB 0 CT1 27XCR 8 STRN (SUTURE) ×1 IMPLANT
SUT VIC AB 2-0 CT1 18 (SUTURE) ×1 IMPLANT
SUTURE EHLN 3-0 FS-10 30 BLK (SUTURE) IMPLANT
SYR 30ML LL (SYRINGE) ×2 IMPLANT
TAPE CLOTH 3X10 WHT NS LF (GAUZE/BANDAGES/DRESSINGS) ×4 IMPLANT
TOWEL OR 17X26 4PK STRL BLUE (TOWEL DISPOSABLE) ×2 IMPLANT
TRAP FLUID SMOKE EVACUATOR (MISCELLANEOUS) ×1 IMPLANT
TRAY FOLEY SLVR 16FR LF STAT (SET/KITS/TRAYS/PACK) IMPLANT
TUBING CONNECTING 10 (TUBING) ×1 IMPLANT
WATER STERILE IRR 1000ML POUR (IV SOLUTION) ×2 IMPLANT

## 2023-05-05 NOTE — Progress Notes (Addendum)
Error

## 2023-05-05 NOTE — Transfer of Care (Signed)
Immediate Anesthesia Transfer of Care Note  Patient: LYNDEL BIELE  Procedure(s) Performed: L3-4 LATERAL LUMBAR INTERBODY FUSION AND POSTERIOR SPINAL FUSION APPLICATION OF INTRAOPERATIVE CT SCAN  Patient Location: PACU  Anesthesia Type:General  Level of Consciousness: awake, drowsy, and patient cooperative  Airway & Oxygen Therapy: Patient Spontanous Breathing and Patient connected to face mask oxygen  Post-op Assessment: Report given to RN and Post -op Vital signs reviewed and stable  Post vital signs: Reviewed and stable  Last Vitals:  Vitals Value Taken Time  BP 170/95 05/05/23 1618  Temp    Pulse 81 05/05/23 1619  Resp 14 05/05/23 1619  SpO2 99 % 05/05/23 1619  Vitals shown include unfiled device data.  Last Pain:  Vitals:   05/05/23 1156  TempSrc: Oral  PainSc: 0-No pain      Patients Stated Pain Goal: 0 (05/05/23 1156)  Complications: No notable events documented.

## 2023-05-05 NOTE — Anesthesia Postprocedure Evaluation (Signed)
Anesthesia Post Note  Patient: [name]  Procedure(s) Performed: L3-4 LATERAL LUMBAR INTERBODY FUSION AND POSTERIOR SPINAL FUSION APPLICATION OF INTRAOPERATIVE CT SCAN  Patient location during evaluation: PACU Anesthesia Type: General Level of consciousness: awake and alert Pain management: pain level controlled Vital Signs Assessment: post-procedure vital signs reviewed and stable Respiratory status: spontaneous breathing, nonlabored ventilation, respiratory function stable and patient connected to nasal cannula oxygen Cardiovascular status: blood pressure returned to baseline and stable Postop Assessment: no apparent nausea or vomiting Anesthetic complications: no   No notable events documented.   Last Vitals:  Vitals:   05/05/23 1800 05/05/23 1817  BP: 114/88 137/87  Pulse: 68 78  Resp: 19 19  Temp: 36.5 C   SpO2: 98% 94%    Last Pain:  Vitals:   05/05/23 1817  TempSrc:   PainSc: 6                  Emily Singleton

## 2023-05-05 NOTE — H&P (Signed)
Referring Physician:  Venetia Night, MD [address] [place],  [place] [address]  Primary Physician:  Marisue Ivan, MD  History of Present Illness: 05/05/2023 Ms. Sturdevant presents with continued symptoms.  03/23/2023 Ms. Quentasia Stmarie is here today with a chief complaint of  low back pain that radiates into the bilateral buttocks and into the posterior legs, occasionally it will stop at the calf and other times it will go into the foot.   She has been having pain for at least 2 years.  She reports sharp, stabbing, and heavy discomfort made worse by walking and standing.  She walks bent forward.  She has been having constant pain in her ability to walk and maintain her activities of daily living has been doing worse over time.  Bowel/Bladder Dysfunction: none  Conservative measures:  Physical therapy: has participated in at [facility], she goes 1 time a month.  Multimodal medical therapy including regular antiinflammatories:  celebrex, cymbalta, tylenol arthritis   Injections:  has received epidural steroid injections 10/14/2022: Right L4-5 and right S1 transforaminal ESI (nearly complete relief x 3 weeks, then sustained 50% relief, dexamethasone 13 mg) 09/10/2022: Right L4-5 and right S1 transforaminal ESI (good relief x 5 days, then 30-40% relief) 07/07/2022: Right hip joint injection (resolution of groin pain) 05/19/2022: RFA to the right L3-4 and L4-5 facet joints (moderate to good relief) 04/30/2022: MBB to the right L3-4 and L4-5 facet joints (9/10 to 0/10) 04/16/2022: MBB to the right L3-4 and L4-5 facet joints (9/10 to 0/10) 11/18/2021: Right L4-5 and right S1 transforaminal ESI (moderate to good relief) 06/27/2021: Right L4-5 and right S1 transforaminal ESI (moderate to good relief) 04/10/2021: Left C4 trigger point injection (good relief) 10/14/2020: Left C4 trigger point injection (good relief) 05/13/2020: Right L4-5 and right S1  transforaminal ESI (moderate relief) 04/03/2020: Right L4-5 and right L5-S1 transforaminal ESI (good relief of right posterior thigh and posterior calf pain, minimal improvement of right buttock pain) 02/02/2020: Right sacroiliac joint injection (mild to moderate relief) 02/18/2017: Right sacroiliac joint injection (good relief) 05/05/2016: Right sacroiliac joint injection (good relief)   Past Surgery: denies  ELROSE COLACINO has no symptoms of cervical myelopathy.  The symptoms are causing a significant impact on the patient's life.   Progress Note from Drake Leach, [place] on 02/16/23:   History of Present Illness: ANACRISTINA PORTANOVA is a 72 y.o. female has a history of GAD, obesity, vitamin D  deficiency, aortic atherosclerosis, IBS, and osteopenia.    Last seen by me on 01/29/23 for back and bilateral leg pain. MRI from 2021 showed slip at L3-L4 and L4-L5. 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    She was sent to PT at last visit and is here to review her lumbar MRI/xray results.    She has done 4 visits of PT at [facility]. No change in symptoms. She continues with constant LBP that radiates into the bilateral buttocks and into the posterior legs (right > left), occasionally it will stop at the calf and other times it will go into the foot. Pain is worse with walking, standing, and bending. Some relief with sitting. No numbness or tingling.   I have utilized the care everywhere function in epic to review the outside records available from external health systems.  Review of Systems:  A 10 point review of systems is negative, except for the pertinent positives and negatives detailed in the HPI.  Past Medical History:  Past Medical History:  Diagnosis Date   Aortic atherosclerosis (HCC)    DDD (degenerative disc disease), lumbar    Depression    Diverticulosis    Endometriosis    Generalized anxiety disorder    GERD (gastroesophageal reflux disease)     possible eosinophilic esophagitis on egd   IBS (irritable bowel syndrome)    Obesity (BMI 35.0-39.9 without comorbidity)    Osteoarthritis    Osteopenia    Spinal stenosis, lumbar    Vitamin D deficiency     Past Surgical History: Past Surgical History:  Procedure Laterality Date   ABDOMINAL HYSTERECTOMY  1980   partial   BUNIONECTOMY Bilateral    COLONOSCOPY  10/25/2019   COLONOSCOPY WITH ESOPHAGOGASTRODUODENOSCOPY (EGD)  07/03/2014   KNEE ARTHROSCOPY Bilateral    REDUCTION MAMMAPLASTY Bilateral 1989   TONSILLECTOMY  1955    Allergies: Allergies as of 04/06/2023   (No Known Allergies)    Medications:  Current Facility-Administered Medications:    ceFAZolin (ANCEF) IVPB 2g/100 mL premix, 2 g, Intravenous, Once, Venetia Night, MD   lactated ringers infusion, , Intravenous, Continuous, Corinda Gubler, MD   vancomycin (VANCOCIN) IVPB 1000 mg/200 mL premix, 1,000 mg, Intravenous, Once, Venetia Night, MD  Social History: Social History   Tobacco Use   Smoking status: Former    Types: Cigarettes   Smokeless tobacco: Never  Vaping Use   Vaping status: Never Used  Substance Use Topics   Alcohol use: Yes    Comment: occasional   Drug use: No    Family Medical History: Family History  Problem Relation Age of Onset   Cancer Sister        colon age 34   Breast cancer Maternal Aunt 47    Physical Examination: Vitals:   05/05/23 1156  BP: (!) 173/96  Pulse: 68  Resp: 14  Temp: 97.9 F (36.6 C)  SpO2: 99%   Heart sounds normal no MRG. Chest Clear to Auscultation Bilaterally.  General: Patient is in no apparent distress. Attention to examination is appropriate.  Neck:   Supple.  Full range of motion.  Respiratory: Patient is breathing without any difficulty.   NEUROLOGICAL:     Awake, alert, oriented to person, place, and time.  Speech is clear and fluent.   Cranial Nerves: Pupils equal round and reactive to light.  Facial tone is symmetric.   Facial sensation is symmetric. Shoulder shrug is symmetric. Tongue protrusion is midline.  There is no pronator drift.  Strength: Side Biceps Triceps Deltoid Interossei Grip Wrist Ext. Wrist Flex.  R 5 5 5 5 5 5 5   L 5 5 5 5 5 5 5    Side Iliopsoas Quads Hamstring PF DF EHL  R 5 5 5 5 5 5   L 5 5 5 5 5 5    Reflexes are 1+ and symmetric at the biceps, triceps, brachioradialis, patella and achilles.   Hoffman's is absent.   Bilateral upper and lower extremity sensation is intact to light touch.    No evidence of dysmetria noted.  Gait is antalgic and stooped.     Medical Decision Making  Imaging: MRI L spine 02/13/2023 IMPRESSION: 1. Generalized lumbar spine degeneration especially affecting the facets with hyperlordosis and anterolisthesis at L3-4 and below. 2. L3-4 advanced and compressive spinal stenosis. 3. Moderate foraminal narrowing bilaterally at L1-2 and L3-4.     Electronically Signed   By: Tiburcio Pea M.D.   On: 02/13/2023 09:45    I have personally reviewed the  images and agree with the above interpretation.  Assessment and Plan: Ms. Legg is a pleasant 72 y.o. 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  She has neurogenic claudication from this.  She is also having back pain.  She has tried and failed conservative management.  At this point, no further conservative management is indicated.  We will proceed with L3-4 lateral lumbar interbody fusion with posterior fixation and fusion.     Shuntay Everetts K. Myer Haff MD, [facility] Neurosurgery

## 2023-05-05 NOTE — Anesthesia Preprocedure Evaluation (Addendum)
Anesthesia Evaluation  Patient identified by MRN, date of birth, ID band Patient awake    Reviewed: Allergy & Precautions, H&P , NPO status , Patient's Chart, lab work & pertinent test results  Airway Mallampati: II  TM Distance: >3 FB Neck ROM: full    Dental no notable dental hx.    Pulmonary former smoker   Pulmonary exam normal        Cardiovascular negative cardio ROS Normal cardiovascular exam     Neuro/Psych  PSYCHIATRIC DISORDERS Anxiety Depression    negative neurological ROS     GI/Hepatic Neg liver ROS,GERD  Controlled and Medicated,,  Endo/Other  negative endocrine ROS    Renal/GU      Musculoskeletal  (+) Arthritis ,    Abdominal   Peds  Hematology negative hematology ROS (+)   Anesthesia Other Findings Past Medical History: No date: Aortic atherosclerosis (HCC) No date: DDD (degenerative disc disease), lumbar No date: Depression No date: Diverticulosis No date: Endometriosis No date: Generalized anxiety disorder No date: GERD (gastroesophageal reflux disease)     Comment:  possible eosinophilic esophagitis on egd No date: IBS (irritable bowel syndrome) No date: Obesity (BMI 35.0-39.9 without comorbidity) No date: Osteoarthritis No date: Osteopenia No date: Spinal stenosis, lumbar No date: Vitamin D deficiency  Past Surgical History: 1980: ABDOMINAL HYSTERECTOMY     Comment:  partial No date: BUNIONECTOMY; Bilateral 10/25/2019: COLONOSCOPY 07/03/2014: COLONOSCOPY WITH ESOPHAGOGASTRODUODENOSCOPY (EGD) No date: KNEE ARTHROSCOPY; Bilateral 1989: REDUCTION MAMMAPLASTY; Bilateral 1955: TONSILLECTOMY     Reproductive/Obstetrics negative OB ROS                             Anesthesia Physical Anesthesia Plan  ASA: 2  Anesthesia Plan: General ETT   Post-op Pain Management: Tylenol PO (pre-op)* and Ketamine IV*   Induction: Intravenous  PONV Risk Score and  Plan: 2 and Ondansetron and Dexamethasone  Airway Management Planned: Oral ETT  Additional Equipment:   Intra-op Plan:   Post-operative Plan: Extubation in OR  Informed Consent: I have reviewed the patients History and Physical, chart, labs and discussed the procedure including the risks, benefits and alternatives for the proposed anesthesia with the patient or authorized representative who has indicated his/her understanding and acceptance.     Dental Advisory Given  Plan Discussed with: CRNA and Surgeon  Anesthesia Plan Comments:         Anesthesia Quick Evaluation

## 2023-05-05 NOTE — Op Note (Signed)
Indications: [name] is a 72 y.o. female with Spondylolisthesis of lumbar region M43.16 , Neurogenic claudication due to lumbar spinal stenosis M48.062.   Findings: expansion of disc space  Preoperative Diagnosis: Spondylolisthesis of lumbar region M43.16 , Neurogenic claudication due to lumbar spinal stenosis M48.062  Postoperative Diagnosis: same   EBL: 50 ml IVF: see anesthesia record Drains: none Disposition: Extubated and Stable to PACU Complications: none  A foley catheter was placed.   Preoperative Note:   Risks of surgery discussed include: infection, bleeding, stroke, coma, death, paralysis, CSF leak, nerve/spinal cord injury, numbness, tingling, weakness, complex regional pain syndrome, recurrent stenosis and/or disc herniation, vascular injury, development of instability, neck/back pain, need for further surgery, persistent symptoms, development of deformity, and the risks of anesthesia. The patient understood these risks and agreed to proceed.  NAME OF ANTERIOR PROCEDURE:               1. Anterior lumbar interbody fusion via a right lateral retroperitoneal approach at L3/4 2. Placement of a Lordotic  Globus Hedron interbody cage, filled with Demineralized Bone Matrix  NAME OF POSTERIOR PROCEDURE 1. Posterior instrumentation using Nuvasive Reline Instrumentation 2. Posterolateral fusion, L3/4, utilizing demineralized bone matrix 3. Use of Stereotaxis   PROCEDURE:  Patient was brought to the operating room, intubated, turned to the lateral position.  All pressure points were checked and double-checked.  The patient was prepped and draped in the standard fashion. Prior to prepping, fluoroscopy was brought in and the patient was positioned with a large bump under the contralateral side between the iliac crest and rib cage, allowing the area between the iliac crest and the lateral aspect of the rib cage to open and increase the ability to reach inferiorly, to  facilitate entry into the disc space.  The incision was marked upon the skin both the location of the disc space as well as the superior most aspect of the iliac crest.  Based on the identification of the disc space an incision was prepared, marked upon the skin and eventually was used for our lateral incision.  The fluoroscopy was turned into a cross table A/P image in order to confirm that the patient's spine remained in a perpendicular trajectory to the floor without rotation.  Once confirming that all the pressure points were checked and double-checked and the patient remained in sturdy position strapped down in this slightly jack-knifed lateral position, the patient was prepped and draped in standard fashion.  The skin was injected with local anesthetic, then incised until the abdominal wall fascia was noted.  I bluntly dissected posteriorly until we were able to identify the posterior musculature near petit's triangle.  At this point, using primarily blunt dissection with our finger aided with a metzenbaum scissor, were able to enter the retroperitoneal cavity.  The retroperitoneal potential space was opened further until palpating out the psoas muscle, the medial aspect of the iliac crest, the medial aspect of the last rib and continued to define the retroperitoneal space with blunt dissection in order to facilitate safe placement of our dilators.    While protecting by dissecting directly onto a finger in the retroperitoneum, the retroperitoneal space was entered safely from the lateral incision and the initial dilator placed onto the muscle belly of the psoas.  While directly stimulating the dilator and after radiographically confirming our location relative to the disc space, I placed the dilator through the psoas.  The dilators were stimulated to ensure remaining safely away from any of  the lumbar plexus nerves; the dilators were repositioned until no pathologic stimulation was appreciated.  Once I  had confirmed the location of our initial dilator radiographically, a K-wire secured the dilator into the L3/4 disc space and confirmed position under A/P and lateral fluoroscopy.  At this point, I dilated up with direct stimulation to confirm lack of pathologic stimulation.  Once all the dilators were in position, I placed in the retractor and secured it onto the table, locked into position and confirmed under A/P and lateral fluoroscopy to confirm our approach angle to the disc space as well as location relative to the disc space.  I then placed the muscle stimulator in through the working channel down to the vertebral body, stimulating the entire lateral surface of the vertebral body and any of the visualized psoas muscle that was adjacent to the retractor, confirming again the safe passage to the psoas before we began performing the discectomy.  At this point, we began our discectomy at L3/4.  The disc was incised laterally throughout the extent of our exposure. Using a combination of pituitary rongeurs, Kerrison rongeurs, rasps, curettes of various sorts, we were able to begin to clean out the disc space.  Once we had cleaned out the majority of the disc space, we then cut the lateral annulus with a cob, breaking the lateral annual attachments on the contralateral side by subtly working the cob through the annulus while using flouroscopy.  Care was taken not to extend further than required after cutting the annular attachments.  After this had been performed, we prepared the endplates for placement of our graft, sized a graft to the disc space by serially dilating up in trial sizes until we confirmed that our graft would be well positioned, allowing distraction while maintaining good grip.  This was confirmed under A/P and lateral fluoroscopy in order to ensure its placement as an eventual trial for placement of our final graft.  We irrigated with bacteriostatic saline.  Once confirmed placement, the Hedron  implant filled with allograft was impacted into position at L3/4.   Through a combination of intradiscal distraction and anterior releasing, we were able to correct the anterior deformity during disc preparation and placement of the graft.  At this point, final radiographs were performed, and we began closure.  The wound was closed using 0 Vicryl interrupted suture in the fascia and 2-0 Vicryl inverted suture were placed in the subcutaneous tissue and dermis. 3-0 monocryl was used for final closure. Dermabond was used to close the skin.    After closing the anterior part in layers, the patient was repositioned into prone position.  All pressure points were checked and double-checked.  The posterior operative site was prepped and draped in standard fashion.  The stereotactic array was placed.  Stereotactic images were acquired using intraoperative CT scanning.  This was registered to the patient.  Using stereotaxis, screw trajectories were planned and incisions made.  The pedicles from L3 to L4 were cannulated bilaterally and K wires used to secure the tracks.  We then utilized a stereotactic screwdriver to place pedicle screws from L3 to L4.  At each level, Nuvasive Reline pedicle screws were placed.  Once the screws were placed, the screw extensions were then linked, a path was formed for the rod and a rod was utilized to connect the screws.  We then compressed, torqued / counter-torqued and removed the screw assembly. Once performed on each side, the C-arm was brought back and to take  confirmatory CT scan showing appropriate placement of all instrumentation and anatomic alignment.    Posterolateral arthrodesis was performed at L3-L4 utilizing demineralized bone matrix.  We irrigated each incision and obtained hemostasis. Each wound was closed using 0 Vicryl interrupted suture in the fascia, 2-0 Vicryl inverted suture were placed in the subcutaneous tissue and dermis. 3-0 monocryl was used for  final closure. Dermabond was used to close the skin.    Needle, lap and all counts were correct at the end of the case.    Manning Charity PA assisted in the entire procedure. An assistant was required for this procedure due to the complexity.  The assistant provided assistance in tissue manipulation and suction, and was required for the successful and safe performance of the procedure. I performed the critical portions of the procedure.   Venetia Night MD Neurosurgery

## 2023-05-05 NOTE — Anesthesia Procedure Notes (Signed)
Procedure Name: Intubation Date/Time: 05/05/2023 1:35 PM  Performed by: Monico Hoar, CRNAPre-anesthesia Checklist: Patient identified, Patient being monitored, Timeout performed, Emergency Drugs available and Suction available Patient Re-evaluated:Patient Re-evaluated prior to induction Oxygen Delivery Method: Circle system utilized Preoxygenation: Pre-oxygenation with 100% oxygen Induction Type: IV induction Ventilation: Mask ventilation without difficulty Laryngoscope Size: Miller and 2 Grade View: Grade I Tube type: Oral Tube size: 7.0 mm Number of attempts: 1 Airway Equipment and Method: Stylet Placement Confirmation: ETT inserted through vocal cords under direct vision, positive ETCO2 and breath sounds checked- equal and bilateral Secured at: 22 cm Tube secured with: Tape Dental Injury: Teeth and Oropharynx as per pre-operative assessment  Comments: Inserted by Hassel Neth

## 2023-05-06 ENCOUNTER — Encounter: Payer: Self-pay | Admitting: Neurosurgery

## 2023-05-06 ENCOUNTER — Other Ambulatory Visit: Payer: Self-pay

## 2023-05-06 MED ORDER — ENOXAPARIN SODIUM 40 MG/0.4ML IJ SOSY
PREFILLED_SYRINGE | INTRAMUSCULAR | Status: AC
  Filled 2023-05-06: qty 0.4

## 2023-05-06 MED ORDER — OXYCODONE HCL 5 MG PO TABS
ORAL_TABLET | ORAL | Status: AC
  Filled 2023-05-06: qty 2

## 2023-05-06 MED ORDER — PANTOPRAZOLE SODIUM 40 MG PO TBEC
DELAYED_RELEASE_TABLET | ORAL | Status: AC
  Filled 2023-05-06: qty 1

## 2023-05-06 MED ORDER — METHOCARBAMOL 1000 MG/10ML IJ SOLN
500.0000 mg | Freq: Four times a day (QID) | INTRAVENOUS | Status: DC
  Filled 2023-05-06: qty 5

## 2023-05-06 MED ORDER — DOCUSATE SODIUM 100 MG PO CAPS
ORAL_CAPSULE | ORAL | Status: AC
  Filled 2023-05-06: qty 1

## 2023-05-06 MED ORDER — METHOCARBAMOL 500 MG PO TABS
ORAL_TABLET | ORAL | Status: AC
  Filled 2023-05-06: qty 1

## 2023-05-06 MED ORDER — SENNA 8.6 MG PO TABS
ORAL_TABLET | ORAL | Status: AC
  Filled 2023-05-06: qty 1

## 2023-05-06 MED ORDER — METHOCARBAMOL 500 MG PO TABS
500.0000 mg | ORAL_TABLET | Freq: Four times a day (QID) | ORAL | Status: DC
  Administered 2023-05-06 – 2023-05-07 (×4): 500 mg via ORAL

## 2023-05-06 MED ORDER — ACETAMINOPHEN 500 MG PO TABS
ORAL_TABLET | ORAL | Status: AC
  Filled 2023-05-06: qty 2

## 2023-05-06 MED ORDER — KETOROLAC TROMETHAMINE 15 MG/ML IJ SOLN
INTRAMUSCULAR | Status: AC
  Filled 2023-05-06: qty 1

## 2023-05-06 NOTE — Evaluation (Signed)
Occupational Therapy Evaluation Patient Details Name: Emily Singleton MRN: [id number] DOB: [birth year] Today's Date: 05/06/2023   History of Present Illness Emily Singleton is a 71yoF who presents for elective lumbar spine surgery in the setting of lumbar spodylolisthesis + radiculopathy. Under the direction of Venetia Night pt underwent anterior lumbar interbody fusion via a right lateral retroperitoneal approach at L3/4 on the twentyfirst of august 2024. PTA was a community AMB adult, fully independent, driving, voluteering 3x/week, 2 falls in past 6 months from tripping episodes.   Clinical Impression   Patient presenting with decreased Ind in self care,balance, functional mobility/transfers, endurance, and safety awareness. Patient reports being Ind at baseline with family living nearby and available to assist at discharge. Pt verbalized back precautions with 100% accuracy. OT provided education on self care tasks with back precautions. Pt reports stabbing needle like pain in buttocks and transitions with "catch like" movements. Pt ambulates 75' to bathroom with RW min guard- min A. Pt unable to void and returns back to room and needing min A for log roll to return to bed. RN notified of pain and requests for medication. Patient will benefit from acute OT to increase overall independence in the areas of ADLs, functional mobility, and safety awareness in order to safely discharge.      If plan is discharge home, recommend the following: A little help with walking and/or transfers;A little help with bathing/dressing/bathroom;Assistance with cooking/housework;Assist for transportation    Functional Status Assessment  Patient has had a recent decline in their functional status and demonstrates the ability to make significant improvements in function in a reasonable and predictable amount of time.  Equipment Recommendations  BSC/3in1       Precautions / Restrictions  Precautions Precautions: Back Required Braces or Orthoses: Spinal Brace Spinal Brace: Applied in sitting position;Lumbar corset Restrictions Weight Bearing Restrictions: No      Mobility Bed Mobility Overal bed mobility: Needs Assistance Bed Mobility: Sit to Supine       Sit to supine: Min assist   General bed mobility comments: min A for B LEs with log roll technique    Transfers Overall transfer level: Needs assistance Equipment used: Rolling walker (2 wheels) Transfers: Sit to/from Stand Sit to Stand: Min assist                  Balance Overall balance assessment: Modified Independent                                         ADL either performed or assessed with clinical judgement   ADL Overall ADL's : Needs assistance/impaired                         Toilet Transfer: Minimal assistance;Rolling walker (2 wheels);Comfort height toilet;Ambulation   Toileting- Clothing Manipulation and Hygiene: Minimal assistance;Sit to/from stand               Vision Baseline Vision/History: 1 Wears glasses Patient Visual Report: No change from baseline              Pertinent Vitals/Pain Pain Assessment Pain Assessment: Faces Faces Pain Scale: Hurts whole lot Pain Location: bilat buttocks Pain Descriptors / Indicators: Jabbing, Shooting, Sharp, Pins and needles Pain Intervention(s): Limited activity within patient's tolerance, Monitored during session, Repositioned, Patient requesting pain meds-RN notified     Extremity/Trunk Assessment Upper Extremity  Assessment Upper Extremity Assessment: Overall WFL for tasks assessed              Cognition Arousal: Alert Behavior During Therapy: WFL for tasks assessed/performed Overall Cognitive Status: Within Functional Limits for tasks assessed                                                  Home Living Family/patient expects to be discharged to:: Private  residence Living Arrangements: Alone Available Help at Discharge: Family Type of Home: House Home Access: Stairs to enter Secretary/administrator of Steps: 2 Entrance Stairs-Rails: Right Home Layout: One level     Bathroom Shower/Tub: Producer, television/film/video: Standard     Home Equipment: Shower seat          Prior Functioning/Environment Prior Level of Function : Independent/Modified Independent;Driving                        OT Problem List: Decreased strength;Decreased activity tolerance;Decreased safety awareness;Impaired balance (sitting and/or standing);Decreased knowledge of use of DME or AE;Decreased range of motion;Pain      OT Treatment/Interventions: Self-care/ADL training;Therapeutic exercise;Therapeutic activities;Energy conservation;DME and/or AE instruction;Patient/family education;Balance training    OT Goals(Current goals can be found in the care plan section) Acute Rehab OT Goals Patient Stated Goal: to go home OT Goal Formulation: With patient Time For Goal Achievement: 05/20/23 Potential to Achieve Goals: Fair ADL Goals Pt Will Perform Grooming: standing;with modified independence Pt Will Perform Lower Body Dressing: with supervision;sit to/from stand;with adaptive equipment Pt Will Transfer to Toilet: with supervision;ambulating Pt Will Perform Toileting - Clothing Manipulation and hygiene: with supervision;sit to/from stand  OT Frequency: Min 1X/week       AM-PAC OT "6 Clicks" Daily Activity     Outcome Measure Help from another person eating meals?: None Help from another person taking care of personal grooming?: None Help from another person toileting, which includes using toliet, bedpan, or urinal?: A Little Help from another person bathing (including washing, rinsing, drying)?: A Little Help from another person to put on and taking off regular upper body clothing?: None Help from another person to put on and taking off  regular lower body clothing?: A Little 6 Click Score: 21   End of Session Equipment Utilized During Treatment: Rolling walker (2 wheels) Nurse Communication: Mobility status;Patient requests pain meds  Activity Tolerance: Patient limited by pain Patient left: in bed;with call bell/phone within reach;with bed alarm set  OT Visit Diagnosis: Unsteadiness on feet (R26.81);Repeated falls (R29.6);Muscle weakness (generalized) (M62.81)                Time: 5621-3086 OT Time Calculation (min): 26 min Charges:  OT General Charges $OT Visit: 1 Visit OT Evaluation $OT Eval Moderate Complexity: 1 Mod OT Treatments $Self Care/Home Management : 8-22 mins  Jackquline Denmark, MS, OTR/L , CBIS ascom [phone]  05/06/23, 1:10 PM

## 2023-05-06 NOTE — Plan of Care (Signed)
  Problem: Education: Goal: Ability to verbalize activity precautions or restrictions will improve Outcome: Progressing Goal: Knowledge of the prescribed therapeutic regimen will improve Outcome: Progressing   Problem: Activity: Goal: Will remain free from falls Outcome: Progressing   Problem: Clinical Measurements: Goal: Ability to maintain clinical measurements within normal limits will improve Outcome: Progressing Goal: Postoperative complications will be avoided or minimized Outcome: Progressing

## 2023-05-06 NOTE — Progress Notes (Signed)
Physical Therapy Treatment Patient Details Name: Emily Singleton MRN: [id number] DOB: Oct 03, 1950 Today's Date: 05/06/2023   History of Present Illness Connelly Aramburu is a 71yoF who presents for elective lumbar spine surgery in the setting of lumbar spodylolisthesis + radiculopathy. Under the direction of Venetia Night pt underwent anterior lumbar interbody fusion via a right lateral retroperitoneal approach at L3/4 on the twentyfirst of august 2024. PTA was a community AMB adult, fully independent, driving, voluteering 3x/week, 2 falls in past 6 months from tripping episodes.    PT Comments  Pt just received muscle relaxer and since has mobilized to BR twice, reports dramatic improvements in pain with meds. Pt is not longer orthostatic with this assessment and denies any presyncope with upright activity. Pt advances AMB distance to 132ft and partakes in stairs training with success and confidence. Will continue to follow. PA made aware of updates.     If plan is discharge home, recommend the following: A little help with walking and/or transfers;A little help with bathing/dressing/bathroom   Can travel by private vehicle        Equipment Recommendations  Rolling walker (2 wheels);BSC/3in1    Recommendations for Other Services       Precautions / Restrictions Precautions Precautions: Back Precaution Booklet Issued: No Required Braces or Orthoses: Spinal Brace Spinal Brace: Lumbar corset Restrictions Weight Bearing Restrictions: No     Mobility  Bed Mobility Overal bed mobility: Needs Assistance Bed Mobility: Sit to Supine     Supine to sit: Min assist Sit to supine: Min assist   General bed mobility comments: cues for log roll again; pt coming reclined to long sitting multiple time in session without pain (unlike log roll)    Transfers Overall transfer level: Needs assistance Equipment used: Rolling walker (2 wheels) Transfers: Sit to/from Stand Sit to  Stand: Supervision           General transfer comment: from elevated surface this time, much improved    Ambulation/Gait Ambulation/Gait assistance: Supervision Gait Distance (Feet): 150 Feet Assistive device: Rolling walker (2 wheels) Gait Pattern/deviations: WFL(Within Functional Limits)       General Gait Details: no presyncope, no complaint, no additional sharp pain provcation as earlier   Stairs Stairs: Yes Stairs assistance: Contact guard assist Stair Management: One rail Right, Step to pattern Number of Stairs: 4 General stair comments: up right, down with left.   Wheelchair Mobility     Tilt Bed    Modified Rankin (Stroke Patients Only)       Balance Overall balance assessment: Modified Independent                                          Cognition Arousal: Alert Behavior During Therapy: WFL for tasks assessed/performed Overall Cognitive Status: Within Functional Limits for tasks assessed                                          Exercises      General Comments        Pertinent Vitals/Pain Pain Assessment Pain Assessment: 0-10 Pain Score: 2  Pain Location: left butt Pain Descriptors / Indicators: Aching Pain Intervention(s): Limited activity within patient's tolerance, Monitored during session, Premedicated before session    Home Living Family/patient expects to be discharged to:: Private  residence Living Arrangements: Alone Available Help at Discharge: Family Type of Home: House Home Access: Stairs to enter Entrance Stairs-Rails: Right Entrance Stairs-Number of Steps: 2   Home Layout: One level Home Equipment: Shower seat      Prior Function            PT Goals (current goals can now be found in the care plan section) Acute Rehab PT Goals Patient Stated Goal: return to home with help from family Time For Goal Achievement: 05/20/23 Potential to Achieve Goals: Good Progress towards PT goals:  Progressing toward goals    Frequency    7X/week      PT Plan      Co-evaluation              AM-PAC PT "6 Clicks" Mobility   Outcome Measure  Help needed turning from your back to your side while in a flat bed without using bedrails?: A Little Help needed moving from lying on your back to sitting on the side of a flat bed without using bedrails?: A Little Help needed moving to and from a bed to a chair (including a wheelchair)?: A Little Help needed standing up from a chair using your arms (e.g., wheelchair or bedside chair)?: A Little Help needed to walk in hospital room?: A Little Help needed climbing 3-5 steps with a railing? : A Little 6 Click Score: 18    End of Session Equipment Utilized During Treatment: Gait belt Activity Tolerance: Patient tolerated treatment well;No increased pain Patient left: in chair;with call bell/phone within reach Nurse Communication: Mobility status PT Visit Diagnosis: Difficulty in walking, not elsewhere classified (R26.2);Other abnormalities of gait and mobility (R26.89)     Time: 8657-8469 PT Time Calculation (min) (ACUTE ONLY): 25 min  Charges:    $Therapeutic Activity: 23-37 mins PT General Charges $$ ACUTE PT VISIT: 1 Visit                    4:15 PM, 05/06/23 Rosamaria Lints, PT, DPT Physical Therapist - [facility]  [phone] (ASCOM)    Alben Jepsen C 05/06/2023, 4:13 PM

## 2023-05-06 NOTE — Progress Notes (Signed)
   Neurosurgery Progress Note  History: Emily Singleton is status post L4-5 XLIF and PSF  POD1: Expected back pain this morning worse on the right than the left that radiates into her hips  Physical Exam: Vitals:   05/06/23 0613 05/06/23 0724  BP: 128/66 133/72  Pulse: 69 (!) 58  Resp: 15 16  Temp: 98.2 F (36.8 C) 97.7 F (36.5 C)  SpO2: 93% 92%    AA Ox3 CNI  Strength:5/5 throughout bilateral lower remedies Incisions clean dry and intact with clean postoperative dressing in place  Data:  Other tests/results: none   Assessment/Plan:  Emily Singleton is a 72 year old presenting with lumbar spondylolisthesis and neurogenic claudication status post  L4-5 XLIF and PSF.   - mobilize - pain control - DVT prophylaxis - PTOT; LSO brace to be worn when out of bed for comfort  Manning Charity PA-C Department of Neurosurgery

## 2023-05-06 NOTE — Evaluation (Signed)
Physical Therapy Evaluation Patient Details Name: Emily Singleton MRN: [id number] DOB: August 01, 1951 Today's Date: 05/06/2023  History of Present Illness  Berdine Mcclaran is a 71yoF who presents for elective lumbar spine surgery in the setting of lumbar spodylolisthesis + radiculopathy. Under the direction of Venetia Night pt underwent anterior lumbar interbody fusion via a right lateral retroperitoneal approach at L3/4 on the twentyfirst of august 2024. PTA was a community AMB adult, fully independent, driving, voluteering 3x/week, 2 falls in past 6 months from tripping episodes.  Clinical Impression  Pt in bed on entry, pain at goal. Pt reports having sat EOB earlier today and stood. Pt requires max effort to achieve unassisted standing, but otherwise moves without physical assist. Pt able to achieve 25ft AMB c RW, but reports steady progression in dizziness, orthostatic BP later seen during assessment. Reviewed education on LSO use which pt dons without assist. Pain AMB with pain controlled, but later in session has much worse 10/10 sharp intermittent pain in left SIJ/buttock area. Pt left up in chair at end of session. Unable to attempt stairs training due to orthostatic presyncope.       If plan is discharge home, recommend the following: A little help with walking and/or transfers;A little help with bathing/dressing/bathroom   Can travel by private vehicle        Equipment Recommendations Rolling walker (2 wheels);BSC/3in1  Recommendations for Other Services       Functional Status Assessment Patient has had a recent decline in their functional status and demonstrates the ability to make significant improvements in function in a reasonable and predictable amount of time.     Precautions / Restrictions Precautions Precautions: Back Required Braces or Orthoses: Spinal Brace Spinal Brace: Applied in sitting position;Lumbar corset Restrictions Weight Bearing Restrictions: No       Mobility  Bed Mobility Overal bed mobility: Needs Assistance Bed Mobility: Supine to Sit     Supine to sit: Supervision, HOB elevated          Transfers Overall transfer level: Needs assistance Equipment used: Rolling walker (2 wheels) Transfers: Sit to/from Stand Sit to Stand: Contact guard assist           General transfer comment: max effort on multiple attempts to achieve successful rise;    Ambulation/Gait Ambulation/Gait assistance: Contact guard assist Gait Distance (Feet): 75 Feet Assistive device: Rolling walker (2 wheels) Gait Pattern/deviations: WFL(Within Functional Limits)       General Gait Details: dizzy upon standing then 'swimmy headed' at 67ft; (subsequently orthostatic)  Stairs            Wheelchair Mobility     Tilt Bed    Modified Rankin (Stroke Patients Only)       Balance Overall balance assessment: Modified Independent                                           Pertinent Vitals/Pain Pain Assessment Pain Assessment: 0-10 Pain Score: 2  (later in session has recurrent sharp 10/10 pains in left SIJ region with LLE weightbearing.) Pain Location: bilat buttocks Pain Descriptors / Indicators: Aching Pain Intervention(s): Limited activity within patient's tolerance, Monitored during session    Home Living Family/patient expects to be discharged to:: Private residence Living Arrangements: Alone Available Help at Discharge: Family (lives on Son's property) Type of Home: House Home Access: Stairs to enter Entrance Stairs-Rails: Right Entrance Stairs-Number  of Steps: 2   Home Layout: One level Home Equipment: None      Prior Function Prior Level of Function : Independent/Modified Independent;Driving (driving, cuts 6 acres grass, volunteers 3 days/week)                     Extremity/Trunk Assessment                Communication      Cognition Arousal: Alert Behavior During  Therapy: WFL for tasks assessed/performed Overall Cognitive Status: Within Functional Limits for tasks assessed                                          General Comments      Exercises     Assessment/Plan    PT Assessment Patient needs continued PT services  PT Problem List Decreased strength;Decreased range of motion;Decreased activity tolerance;Decreased balance;Decreased mobility       PT Treatment Interventions DME instruction    PT Goals (Current goals can be found in the Care Plan section)  Acute Rehab PT Goals Patient Stated Goal: return to home with help from family PT Goal Formulation: With patient Time For Goal Achievement: 05/20/23 Potential to Achieve Goals: Good    Frequency 7X/week     Co-evaluation               AM-PAC PT "6 Clicks" Mobility  Outcome Measure Help needed turning from your back to your side while in a flat bed without using bedrails?: A Little Help needed moving from lying on your back to sitting on the side of a flat bed without using bedrails?: A Little Help needed moving to and from a bed to a chair (including a wheelchair)?: A Little Help needed standing up from a chair using your arms (e.g., wheelchair or bedside chair)?: A Little Help needed to walk in hospital room?: A Little Help needed climbing 3-5 steps with a railing? : A Little 6 Click Score: 18    End of Session Equipment Utilized During Treatment: Gait belt Activity Tolerance: Patient tolerated treatment well;No increased pain Patient left: in chair;with call bell/phone within reach Nurse Communication: Mobility status PT Visit Diagnosis: Difficulty in walking, not elsewhere classified (R26.2);Other abnormalities of gait and mobility (R26.89)    Time: 0930-1001 PT Time Calculation (min) (ACUTE ONLY): 31 min   Charges:   PT Evaluation $PT Eval Moderate Complexity: 1 Mod   PT General Charges $$ ACUTE PT VISIT: 1 Visit       10:24 AM,  05/06/23 Rosamaria Lints, PT, DPT Physical Therapist - [facility]  [phone] (ASCOM)      Camree Wigington C 05/06/2023, 10:20 AM

## 2023-05-07 ENCOUNTER — Telehealth: Payer: Self-pay | Admitting: Neurosurgery

## 2023-05-07 MED ORDER — ENOXAPARIN SODIUM 40 MG/0.4ML IJ SOSY
PREFILLED_SYRINGE | INTRAMUSCULAR | Status: AC
  Filled 2023-05-07: qty 0.4

## 2023-05-07 MED ORDER — ACETAMINOPHEN 500 MG PO TABS
ORAL_TABLET | ORAL | Status: AC
  Filled 2023-05-07: qty 2

## 2023-05-07 MED ORDER — METHOCARBAMOL 500 MG PO TABS
ORAL_TABLET | ORAL | Status: AC
  Filled 2023-05-07: qty 1

## 2023-05-07 MED ORDER — PANTOPRAZOLE SODIUM 40 MG PO TBEC
DELAYED_RELEASE_TABLET | ORAL | Status: AC
  Filled 2023-05-07: qty 1

## 2023-05-07 MED ORDER — SENNA 8.6 MG PO TABS
ORAL_TABLET | ORAL | Status: AC
  Filled 2023-05-07: qty 1

## 2023-05-07 MED ORDER — DOCUSATE SODIUM 100 MG PO CAPS
ORAL_CAPSULE | ORAL | Status: AC
  Filled 2023-05-07: qty 1

## 2023-05-07 MED ORDER — OXYCODONE HCL 5 MG PO TABS
ORAL_TABLET | ORAL | Status: AC
  Filled 2023-05-07: qty 1

## 2023-05-07 MED ORDER — METHOCARBAMOL 500 MG PO TABS
500.0000 mg | ORAL_TABLET | Freq: Four times a day (QID) | ORAL | 0 refills | Status: AC
Start: 2023-05-07 — End: ?

## 2023-05-07 MED ORDER — SENNA 8.6 MG PO TABS: 1.0000 | ORAL_TABLET | Freq: Two times a day (BID) | ORAL | 0 refills | Status: DC

## 2023-05-07 MED ORDER — OXYCODONE HCL 5 MG PO TABS: 5.0000 mg | ORAL_TABLET | ORAL | 0 refills | Status: DC | PRN

## 2023-05-07 NOTE — Telephone Encounter (Signed)
She is status post L3-4 XLIF and posterior spinal fusion on 05/05/23. [facility] [place]   PMP Reviewed and is appropriate.   [facility] [place] with patient and let her know.

## 2023-05-07 NOTE — Discharge Instructions (Signed)
  Your surgeon has performed an operation on your lumbar spine (low back) to relieve pressure on one or more nerves. Many times, patients feel better immediately after surgery and can "overdo it." Even if you feel well, it is important that you follow these activity guidelines. If you do not let your back heal properly from the surgery, you can increase the chance of hardware complications and/or return of your symptoms. The following are instructions to help in your recovery once you have been discharged from the hospital.  Do not use NSAIDs for 3 months after surgery.   Activity    No bending, lifting, or twisting ("BLT"). Avoid lifting objects heavier than 10 pounds (gallon milk jug).  Where possible, avoid household activities that involve lifting, bending, pushing, or pulling such as laundry, vacuuming, grocery shopping, and childcare. Try to arrange for help from friends and family for these activities while your back heals.  Increase physical activity slowly as tolerated.  Taking short walks is encouraged, but avoid strenuous exercise. Do not jog, run, bicycle, lift weights, or participate in any other exercises unless specifically allowed by your doctor. Avoid prolonged sitting, including car rides.  Talk to your doctor before resuming sexual activity.  You should not drive until cleared by your doctor.  Until released by your doctor, you should not return to work or school.  You should rest at home and let your body heal.   You may shower three days after your surgery.  After showering, lightly dab your incision dry. Do not take a tub bath or go swimming for 3 weeks, or until approved by your doctor at your follow-up appointment.  If you smoke, we strongly recommend that you quit.  Smoking has been proven to interfere with normal healing in your back and will dramatically reduce the success rate of your surgery. Please contact [facility] (800-QUIT-NOW) and use the resources at  www.[facility].com for assistance in stopping smoking.  Surgical Incision   If you have a dressing on your incision, you may remove it three days after your surgery. Keep your incision area clean and dry.  Your incision was closed with Dermabond glue. The glue should begin to peel away within about a week.  Diet            You may return to your usual diet. Be sure to stay hydrated.  When to Contact Us  Although your surgery and recovery will likely be uneventful, you may have some residual numbness, aches, and pains in your back and/or legs. This is normal and should improve in the next few weeks.  However, should you experience any of the following, contact us immediately: New numbness or weakness Pain that is progressively getting worse, and is not relieved by your pain medications or rest Bleeding, redness, swelling, pain, or drainage from surgical incision Chills or flu-like symptoms Fever greater than 101.0 F (38.3 C) Problems with bowel or bladder functions Difficulty breathing or shortness of breath Warmth, tenderness, or swelling in your calf  Contact Information During office hours (Monday-Friday 9 am to 5 pm), please call your physician at [phone] and ask for Kendelyn Jean After hours and weekends, please call [phone] and speak with the neurosurgeon on call For a life-threatening emergency, call 911 

## 2023-05-07 NOTE — Telephone Encounter (Signed)
[facility] [place] [facility] [address] Spoke with Selena Batten the pharmacist they do not have oxycodone and advised to cancel the prescription there. Will let Kennyth Arnold know and will [facility] [place] on [address]

## 2023-05-07 NOTE — Telephone Encounter (Signed)
L3-4 XLIF/PSF on 05/05/23  Oxy[facility]otin [facility] not [facility], [facility]

## 2023-05-07 NOTE — Progress Notes (Signed)
Physical Therapy Treatment Patient Details Name: Emily Singleton MRN: [id number] DOB: [birth year] Today's Date: 05/07/2023   History of Present Illness Emily Singleton is a 71yoF who presents for elective lumbar spine surgery in the setting of lumbar spodylolisthesis + radiculopathy. Under the direction of Venetia Night pt underwent anterior lumbar interbody fusion via a right lateral retroperitoneal approach at L3/4 on the twentyfirst of august 2024. PTA was a community AMB adult, fully independent, driving, voluteering 3x/week, 2 falls in past 6 months from tripping episodes.    PT Comments  Pt up to chair on arrival. Pt reports discomfort, noted no pain meds recently, although still within therapeutic time for muscle relaxer. Author suspects recliner position to be playing a role. Pt demonstrates transfers and AMB with excellent tolerance, safety awareness, appropriate use of DME, high confidence for mobility tasks required at DC. Pt declines any additional practice on stairs. Pt says she's ready to DC to home today. Positioned to comfort at end of session, RN made aware of request for pain meds.    If plan is discharge home, recommend the following: A little help with walking and/or transfers;A little help with bathing/dressing/bathroom   Can travel by private vehicle        Equipment Recommendations  Rolling walker (2 wheels);BSC/3in1    Recommendations for Other Services       Precautions / Restrictions Precautions Precautions: Back Precaution Booklet Issued: No Required Braces or Orthoses: Spinal Brace Spinal Brace: Lumbar corset Restrictions Weight Bearing Restrictions: No     Mobility  Bed Mobility               General bed mobility comments: in recliner on arrival    Transfers Overall transfer level: Modified independent Equipment used: Rolling walker (2 wheels) Transfers: Sit to/from Stand Sit to Stand: Modified independent (Device/Increase time)                 Ambulation/Gait Ambulation/Gait assistance: Supervision Gait Distance (Feet): 300 Feet Assistive device: Rolling walker (2 wheels) Gait Pattern/deviations: WFL(Within Functional Limits)       General Gait Details: no presyncope; pain stable. Pt moving generally well, confident with home DC today.   Stairs Stairs: Yes (decliner, all education performed previous day)           Wheelchair Mobility     Tilt Bed    Modified Rankin (Stroke Patients Only)       Balance                                            Cognition                                                Exercises      General Comments        Pertinent Vitals/Pain Pain Assessment Pain Assessment: 0-10 Pain Score: 3  Pain Location: sorteness in back Pain Intervention(s): Limited activity within patient's tolerance, Monitored during session, Premedicated before session    Home Living                          Prior Function            PT Goals (current goals can  now be found in the care plan section) Acute Rehab PT Goals Patient Stated Goal: return to home with help from family PT Goal Formulation: With patient Time For Goal Achievement: 05/20/23 Potential to Achieve Goals: Good Progress towards PT goals: Goals met/education completed, patient discharged from PT    Frequency    7X/week      PT Plan      Co-evaluation              AM-PAC PT "6 Clicks" Mobility   Outcome Measure  Help needed turning from your back to your side while in a flat bed without using bedrails?: A Little Help needed moving from lying on your back to sitting on the side of a flat bed without using bedrails?: A Little Help needed moving to and from a bed to a chair (including a wheelchair)?: A Little Help needed standing up from a chair using your arms (e.g., wheelchair or bedside chair)?: A Little Help needed to walk in hospital  room?: A Little Help needed climbing 3-5 steps with a railing? : A Little 6 Click Score: 18    End of Session Equipment Utilized During Treatment: Gait belt Activity Tolerance: Patient tolerated treatment well;No increased pain Patient left: in chair;with call bell/phone within reach Nurse Communication: Mobility status PT Visit Diagnosis: Difficulty in walking, not elsewhere classified (R26.2);Other abnormalities of gait and mobility (R26.89)     Time: 1610-9604 PT Time Calculation (min) (ACUTE ONLY): 13 min  Charges:    $Therapeutic Activity: 8-22 mins PT General Charges $$ ACUTE PT VISIT: 1 Visit                    12:12 PM, 05/07/23 Rosamaria Lints, PT, DPT Physical Therapist - [facility]  [phone] (ASCOM)    Karlisha Mathena C 05/07/2023, 12:10 PM

## 2023-05-07 NOTE — Plan of Care (Signed)
  Problem: Activity: Goal: Ability to avoid complications of mobility impairment will improve Outcome: Progressing Goal: Ability to tolerate increased activity will improve Outcome: Progressing   Problem: Clinical Measurements: Goal: Postoperative complications will be avoided or minimized Outcome: Progressing   

## 2023-05-07 NOTE — Telephone Encounter (Signed)
Looks like Emily Singleton sent oxycodone to [facility] in [place] on [address].   Please call to cancel the prescription there and/or verify they don't have it.   Let me know once this is done and I can sent to the [facility] in [place]- the one on [address]?

## 2023-05-07 NOTE — Progress Notes (Signed)
   Neurosurgery Progress Note  History: Emily Singleton is status post L4-5 XLIF and PSF  POD2: Improved hip pain this morning  POD1: Expected back pain this morning worse on the right than the left that radiates into her hips  Physical Exam: Vitals:   05/07/23 0155 05/07/23 0619  BP: 136/70 (!) 142/72  Pulse: (!) 59 72  Resp: 16 16  Temp: (!) 96.8 F (36 C) (!) 97.1 F (36.2 C)  SpO2: 96% 96%    AA Ox3 CNI  Strength:5/5 throughout bilateral lower remedies Incisions clean dry and intact with clean postoperative dressing in place  Data:  Other tests/results: none   Assessment/Plan:  Emily Singleton is a 72 year old presenting with lumbar spondylolisthesis and neurogenic claudication status post  L4-5 XLIF and PSF.   - mobilize - pain control - DVT prophylaxis - PTOT; LSO brace to be worn when out of bed for comfort - dispo planning underway  Manning Charity PA-C Department of Neurosurgery

## 2023-05-07 NOTE — Progress Notes (Signed)
Patient is not able to walk the distance required to go the bathroom, or he/she is unable to safely negotiate stairs required to access the bathroom.  A 3in1 BSC will alleviate this problem  

## 2023-05-07 NOTE — Discharge Summary (Signed)
Discharge Summary  Patient ID: Emily Singleton MRN: [id number] DOB/AGE: 03/20/1951 72 y.o.  Admit date: 05/05/2023 Discharge date: 05/07/2023  Admission Diagnoses: Spondylolisthesis of lumbar region M43.16 , Neurogenic claudication due to lumbar spinal stenosis M48.062  Discharge Diagnoses:  Principal Problem:   S/P lumbar fusion Active Problems:   Spondylolisthesis of lumbar region   Neurogenic claudication due to lumbar spinal stenosis   Discharged Condition: good  Hospital Course:  Emily Singleton is a 72 year old presenting with lumbar spondylolisthesis and neurogenic claudication status post L3-4 XLIF and posterior spinal fusion.  Her intraoperative course was uncomplicated.  She was admitted for pain control and therapy evaluation.  She did experience some mild orthostatic hypotension on postop day 1.  This resolved.  She was evaluated by therapy and deemed appropriate for discharge home on postop day 2 with home health.  She was given prescriptions for oxycodone, Robaxin, and senna.  Consults: None  Significant Diagnostic Studies: none  Treatments: surgery: As above.  Please see separately dictated operative report for further details.  Discharge Exam: Blood pressure (!) 142/72, pulse 72, temperature (!) 97.1 F (36.2 C), temperature source Temporal, resp. rate 16, height 5\' 6"  (1.676 m), weight 102.1 kg, SpO2 96%. CN grossly intact 5/5 throughout BLE Incisions covered with post-op dressing  Disposition: Discharge disposition: 01-Home or Self Care       Discharge Instructions     Incentive spirometry RT   Complete by: As directed       Allergies as of 05/07/2023   No Known Allergies      Medication List     TAKE these medications    colestipol 1 g tablet Commonly known as: COLESTID Take 1 g by mouth daily.   DULoxetine 30 MG capsule Commonly known as: CYMBALTA Take 30 mg by mouth at bedtime.   methocarbamol 500 MG tablet Commonly known  as: ROBAXIN Take 1 tablet (500 mg total) by mouth every 6 (six) hours.   omeprazole 20 MG capsule Commonly known as: PRILOSEC Take 20 mg by mouth daily as needed.   oxyCODONE 5 MG immediate release tablet Commonly known as: Oxy IR/ROXICODONE Take 1 tablet (5 mg total) by mouth every 4 (four) hours as needed for moderate pain ((score 4 to 6)).   senna 8.6 MG Tabs tablet Commonly known as: SENOKOT Take 1 tablet (8.6 mg total) by mouth 2 (two) times daily.               Durable Medical Equipment  (From admission, onward)           Start     Ordered   05/07/23 0940  For home use only DME Bedside commode  Once       Question:  Patient needs a bedside commode to treat with the following condition  Answer:  Impaired mobility   05/07/23 0939   05/07/23 0939  For home use only DME Walker rolling  Once       Question Answer Comment  Walker: With 5 Inch Wheels   Patient needs a walker to treat with the following condition Impaired mobility      05/07/23 0939             Signed: Susanne Borders 05/07/2023, 12:11 PM

## 2023-05-07 NOTE — TOC Progression Note (Signed)
Transition of Care [facility]) - Progression Note    Patient Details  Name: Emily Singleton MRN: [id number] Date of Birth: [birth year]  Transition of Care [facility]) CM/SW Contact  Marlowe Sax, RN Phone Number: 05/07/2023, 9:50 AM  Clinical Narrative:     The patient is set u with [facility] for [facility] prior to surgery by Surgeons office, RW and 3 in 1 to be delivered by [facility] to the Bedside  Expected Discharge Plan: Home w Home Health Services Barriers to Discharge: No Barriers Identified  Expected Discharge Plan and Services   Discharge Planning Services: CM Consult   Living arrangements for the past 2 months: Single Family Home Expected Discharge Date: 05/07/23               DME Arranged: Dan Humphreys rolling, 3-N-1 DME Agency: [facility] Date DME Agency Contacted: 05/07/23 Time DME Agency Contacted: [phone] Representative spoke with at DME Agency: Osvaldo Angst Arranged: PT, OT [facility] Agency: [facility] Date [facility] Agency Contacted: 05/07/23 Time HH Agency Contacted: [phone] Representative spoke with at [facility] Agency: Coralee North   Social Determinants of Health (SDOH) Interventions SDOH Screenings   Food Insecurity: No Food Insecurity (05/05/2023)  Housing: Low Risk  (05/05/2023)  Transportation Needs: No Transportation Needs (05/05/2023)  Utilities: Not At Risk (05/05/2023)  Financial Resource Strain: Low Risk  (04/29/2023)   Received from [facility]  Tobacco Use: Medium Risk (05/05/2023)    Readmission Risk Interventions     No data to display

## 2023-05-07 NOTE — Addendum Note (Signed)
Addended byDrake Leach on: 05/07/2023 04:11 PM   Modules accepted: Orders

## 2023-05-12 ENCOUNTER — Telehealth: Payer: Self-pay | Admitting: Neurosurgery

## 2023-05-12 NOTE — Telephone Encounter (Signed)
L3-4 XLIF/PSF on 05/05/23  [facility] from [place] is calling. Patient is not feeling well, depressed, sad, bloated, and she is not eating. She feels nauseated when she sees food. Her incision looks good. Her vitals are fine. She has not had a BM since Friday and she has been taking Murelax but that is not helping. Is there anything else that you can recommend?

## 2023-05-12 NOTE — Telephone Encounter (Signed)
I spoke to patient and asked her about her symptoms. She states she has not had a bowel movement since Friday. She has been passing gas and does feel movement in the abdomen. She states they took her temperature this morning at PT and it was normal as well as her BP. She states she is eating but nothing sounds good. I encouraged her to try to eat something when she is taking her medications. I informed her that she can get OTC Magnesium Citrate and it should help to move things along. She seemed in good spirit on the phone, I informed her to give [place] a call back if she does not have a bowel movement after drinking the magnesium. Patient voiced understanding.

## 2023-05-14 NOTE — Progress Notes (Unsigned)
   REFERRING PHYSICIAN:  Marisue Ivan, Md [address] [place],  [place] [address]  DOS: 05/05/23  L3-L4 XLIF and posterior spinal fusion   HISTORY OF PRESENT ILLNESS: Emily Singleton is approximately 2 weeks status post  L3-L4 XLIF and posterior spinal fusion. Was given oxycodone and robaxin on discharge from the hospital.   Her preop bilateral leg pain is better! She notes some left buttock pain. She also notes swelling in both legs that does not improve after elevating them. She is concerned about a blood clot.   She is taking oxycodone usually one at night. She is not taking robaxin.    PHYSICAL EXAMINATION:  General: Patient is well developed, well nourished, calm, collected, and in no apparent distress.   NEUROLOGICAL:  General: In no acute distress.   Awake, alert, oriented to person, place, and time.  Pupils equal round and reactive to light.  Facial tone is symmetric.     Strength:            Side Iliopsoas Quads Hamstring PF DF EHL  R 5 5 5 5 5 5   L 5 5 5 5 5 5    Incisions c/d/i  She has moderate swelling in both legs mostly in her ankles and feet. She has no calf tenderness. No redness noted. No significant calf swelling.   ROS (Neurologic):  Negative except as noted above  IMAGING: Nothing new to review.   ASSESSMENT/PLAN:  Emily Singleton is doing well s/p above surgery. Treatment options reviewed with patient and following plan made:   - I have advised the patient to lift up to 10 pounds until 6 weeks after surgery (follow up with Dr. Myer Haff).  - Reviewed wound care.  - No bending, twisting, or lifting. Wear brace when up and walking.  - Order [place] of both legs to rule out DVT. This was negative for DVT. She was advised to f/u with her PCP regarding leg swelling.  - Continue on current medications including prn oxycodone. No refills needed.   - Follow up as scheduled in 4 weeks and prn.   Advised to contact the  office if any questions or concerns arise.  Drake Leach PA-C Department of neurosurgery

## 2023-05-19 ENCOUNTER — Telehealth: Payer: Self-pay | Admitting: Orthopedic Surgery

## 2023-05-19 ENCOUNTER — Encounter: Payer: Self-pay | Admitting: Orthopedic Surgery

## 2023-05-19 ENCOUNTER — Ambulatory Visit
Admission: RE | Admit: 2023-05-19 | Discharge: 2023-05-19 | Disposition: A | Discharge status: Home / Self Care | Payer: Medicare Other | Source: Ambulatory Visit | Attending: Orthopedic Surgery | Admitting: Orthopedic Surgery

## 2023-05-19 ENCOUNTER — Ambulatory Visit (INDEPENDENT_AMBULATORY_CARE_PROVIDER_SITE_OTHER): Payer: Medicare Other | Admitting: Orthopedic Surgery

## 2023-05-19 VITALS — BP 132/78 | Temp 97.6°F | Ht 66.0 in | Wt 225.0 lb

## 2023-05-19 DIAGNOSIS — Z981 Arthrodesis status: Secondary | ICD-10-CM

## 2023-05-19 DIAGNOSIS — M4316 Spondylolisthesis, lumbar region: Secondary | ICD-10-CM

## 2023-05-19 DIAGNOSIS — R2243 Localized swelling, mass and lump, lower limb, bilateral: Secondary | ICD-10-CM | POA: Diagnosis present

## 2023-05-19 DIAGNOSIS — Z09 Encounter for follow-up examination after completed treatment for conditions other than malignant neoplasm: Secondary | ICD-10-CM

## 2023-05-19 NOTE — Telephone Encounter (Signed)
Please call and let her know that her doppler was negative for blood clots.   She should follow up with PCP regarding the leg swelling.

## 2023-05-19 NOTE — Telephone Encounter (Signed)
I have notified the patient of the DVT results on her voicemail.

## 2023-05-31 ENCOUNTER — Encounter: Payer: Self-pay | Admitting: Neurosurgery

## 2023-06-14 ENCOUNTER — Other Ambulatory Visit: Payer: Self-pay | Admitting: Family Medicine

## 2023-06-14 DIAGNOSIS — M5416 Radiculopathy, lumbar region: Secondary | ICD-10-CM

## 2023-06-17 ENCOUNTER — Ambulatory Visit
Admission: RE | Admit: 2023-06-17 | Discharge: 2023-06-17 | Disposition: A | Discharge status: Home / Self Care | Payer: Medicare Other | Source: Ambulatory Visit | Attending: Neurosurgery | Admitting: Neurosurgery

## 2023-06-17 ENCOUNTER — Encounter: Payer: Self-pay | Admitting: Neurosurgery

## 2023-06-17 ENCOUNTER — Ambulatory Visit
Admission: RE | Admit: 2023-06-17 | Discharge: 2023-06-17 | Disposition: A | Discharge status: Home / Self Care | Payer: Medicare Other | Attending: Neurosurgery | Admitting: Neurosurgery

## 2023-06-17 ENCOUNTER — Ambulatory Visit: Payer: Medicare Other | Admitting: Neurosurgery

## 2023-06-17 VITALS — BP 132/82 | Temp 97.9°F | Ht 66.0 in | Wt 225.0 lb

## 2023-06-17 DIAGNOSIS — Z981 Arthrodesis status: Secondary | ICD-10-CM

## 2023-06-17 DIAGNOSIS — M5416 Radiculopathy, lumbar region: Secondary | ICD-10-CM | POA: Diagnosis present

## 2023-06-17 DIAGNOSIS — M4316 Spondylolisthesis, lumbar region: Secondary | ICD-10-CM

## 2023-06-17 NOTE — Progress Notes (Signed)
   REFERRING PHYSICIAN:  Marisue Ivan, Md [address] [place],  [place] [address]  DOS: 05/05/23  L3-L4 XLIF and posterior spinal fusion   HISTORY OF PRESENT ILLNESS: EVAN OSBURN is status post  L3-L4 XLIF and posterior spinal fusion.  She is doing very well.  Her leg pain is gone.  She is having some discomfort, but it is mild.  PHYSICAL EXAMINATION:  General: Patient is well developed, well nourished, calm, collected, and in no apparent distress.   NEUROLOGICAL:  General: In no acute distress.   Awake, alert, oriented to person, place, and time.  Pupils equal round and reactive to light.  Facial tone is symmetric.     Strength:            Side Iliopsoas Quads Hamstring PF DF EHL  R 5 5 5 5 5 5   L 5 5 5 5 5 5    Incisions c/d/i    ROS (Neurologic):  Negative except as noted above  IMAGING: No complications noted  ASSESSMENT/PLAN:  GLADINE PLUDE is doing well s/p above surgery.   I am very pleased with her response to surgery.  She will begin slowly increasing her activity.  Will see her back in 6 weeks.  From my standpoint, she can pursue her knee surgery whenever she is ready for it at this point.  Venetia Night Department of neurosurgery

## 2023-07-12 NOTE — Progress Notes (Addendum)
   REFERRING PHYSICIAN:  Marisue Ivan, Md [address] [place],  [place] [address]  DOS: 05/05/23  L3-L4 XLIF and posterior spinal fusion   HISTORY OF PRESENT ILLNESS: Emily Singleton was doing well at her last visit with Dr. Myer Haff on 06/17/23.   She called in yesterday with a few week history of constant right sided LBP. No leg pain. Some relief with motrin, but it makes her sleepy. Worse with prolonged standing and at night. Better with rest.   She has been very active lately and started doing some core strengthening exercises. No known injury.    PHYSICAL EXAMINATION:  General: Patient is well developed, well nourished, calm, collected, and in no apparent distress.   NEUROLOGICAL:  General: In no acute distress.   Awake, alert, oriented to person, place, and time.  Pupils equal round and reactive to light.  Facial tone is symmetric.     Strength:           Side Iliopsoas Quads Hamstring PF DF EHL  R 5 5 5 5 5 5   L 5 5 5 5 5 5    Incisions well healed.  Minimal tenderness at incisions. Most of her tenderness is over her right SI joint.    ROS (Neurologic):  Negative except as noted above  IMAGING: Nothing new to review.   ASSESSMENT/PLAN:  KAELEE YALDA is having some increased right sided LBP/SI joint pain that is worse with standing. No leg pain. No numbness, tingling, or weakness. Treatment options reviewed with patient and following plan made:   - Lumbar xrays ordered.  - She will stop core strengthening exercises for now.  - Okay to continue on prn ibuprofen. Take with food.  - Discussed medrol dose pack, but she wants to hold off.  - Will message her after I review her xrays today or tomorrow. If they look good, then will start PT at [facility].  - Will keep follow up on 11/12 with me for now. Will not need xrays.   Advised to contact the office if any questions or concerns arise.  ADDENDUM 08/06/23:  Xrays of lumbar  spine dated 07/13/23 (report just back 08/05/23):  FINDINGS: Mild compression deformity T12. 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 Pedicle fusion discectomy at L3-4. Grade 1 L5 retrolisthesis. Lumbosacral degenerative changes L5-S1. No osteolytic or osteoblastic changes.   IMPRESSION: Degenerative and postoperative changes.     Electronically Signed   By: Layla Maw M.D.   On: 08/05/2023 18:07  She has no pain in TL junction. She had no tenderness in this area on her exam. Most of her pain remains in right lower lumbar/SI joint region.   I called and discussed results with her. She is improving with PT. Discussed xray findings and that we could get MRI to tell for sure if she has compression fracture. She declines. Can revisit if she states having TL pain.   Emily Leach PA-C Department of neurosurgery

## 2023-07-12 NOTE — Telephone Encounter (Signed)
Please schedule her with me or Danielle for follow up visit. Increased LBP s/p L3-L4 XLIF/PSF on 05/05/23.

## 2023-07-12 NOTE — Telephone Encounter (Signed)
DOS: 05/05/23  L3-L4 XLIF and posterior spinal fusion

## 2023-07-13 ENCOUNTER — Ambulatory Visit
Admission: RE | Admit: 2023-07-13 | Discharge: 2023-07-13 | Disposition: A | Discharge status: Home / Self Care | Payer: Medicare Other | Attending: Orthopedic Surgery | Admitting: Orthopedic Surgery

## 2023-07-13 ENCOUNTER — Ambulatory Visit
Admission: RE | Admit: 2023-07-13 | Discharge: 2023-07-13 | Disposition: A | Discharge status: Home / Self Care | Payer: Medicare Other | Source: Ambulatory Visit | Attending: Orthopedic Surgery | Admitting: Orthopedic Surgery

## 2023-07-13 ENCOUNTER — Encounter: Payer: Self-pay | Admitting: Orthopedic Surgery

## 2023-07-13 ENCOUNTER — Ambulatory Visit (INDEPENDENT_AMBULATORY_CARE_PROVIDER_SITE_OTHER): Payer: Medicare Other | Admitting: Orthopedic Surgery

## 2023-07-13 VITALS — BP 138/86 | Ht 66.0 in | Wt 225.0 lb

## 2023-07-13 DIAGNOSIS — M48062 Spinal stenosis, lumbar region with neurogenic claudication: Secondary | ICD-10-CM

## 2023-07-13 DIAGNOSIS — Z981 Arthrodesis status: Secondary | ICD-10-CM

## 2023-07-14 DIAGNOSIS — M47816 Spondylosis without myelopathy or radiculopathy, lumbar region: Secondary | ICD-10-CM

## 2023-07-14 DIAGNOSIS — Z981 Arthrodesis status: Secondary | ICD-10-CM

## 2023-07-23 NOTE — Progress Notes (Deleted)
   REFERRING PHYSICIAN:  Marisue Ivan, Md [address] [place],  [place] [address]  DOS: 05/05/23  L3-L4 XLIF and posterior spinal fusion   HISTORY OF PRESENT ILLNESS: Emily Singleton was last seen by me on 07/13/23 with increased right sided LBP. Xrays looked good and she was sent to PT.   She is here for follow up.      She called in yesterday with a few week history of constant right sided LBP. No leg pain. Some relief with motrin, but it makes her sleepy. Worse with prolonged standing and at night. Better with rest.   She has been very active lately and started doing some core strengthening exercises. No known injury.    PHYSICAL EXAMINATION:  General: Patient is well developed, well nourished, calm, collected, and in no apparent distress.   NEUROLOGICAL:  General: In no acute distress.   Awake, alert, oriented to person, place, and time.  Pupils equal round and reactive to light.  Facial tone is symmetric.     Strength:         Side Iliopsoas Quads Hamstring PF DF EHL  R 5 5 5 5 5 5   L 5 5 5 5 5 5    Incisions well healed.  Minimal tenderness at incisions. Most of her tenderness is over her right SI joint.    ROS (Neurologic):  Negative except as noted above  IMAGING: Nothing new to review.   ASSESSMENT/PLAN:  Emily Singleton is having some increased right sided LBP/SI joint pain that is worse with standing. No leg pain. No numbness, tingling, or weakness. Treatment options reviewed with patient and following plan made:   - Lumbar xrays ordered.  - She will stop core strengthening exercises for now.  - Okay to continue on prn ibuprofen. Take with food.  - Discussed medrol dose pack, but she wants to hold off.  - Will message her after I review her xrays today or tomorrow. If they look good, then will start PT at [facility].  - Will keep follow up on 11/12 with me for now. Will not need xrays.   Advised to contact the office  if any questions or concerns arise.  Drake Leach PA-C Department of neurosurgery

## 2023-07-27 ENCOUNTER — Encounter: Payer: Medicare Other | Admitting: Orthopedic Surgery

## 2023-07-27 DIAGNOSIS — Z981 Arthrodesis status: Secondary | ICD-10-CM

## 2023-08-18 ENCOUNTER — Other Ambulatory Visit: Payer: Self-pay | Admitting: Orthopedic Surgery

## 2023-08-19 ENCOUNTER — Ambulatory Visit: Payer: Medicare Other | Admitting: Dermatology

## 2023-08-19 ENCOUNTER — Encounter: Payer: Self-pay | Admitting: Dermatology

## 2023-08-19 DIAGNOSIS — W908XXA Exposure to other nonionizing radiation, initial encounter: Secondary | ICD-10-CM | POA: Diagnosis not present

## 2023-08-19 DIAGNOSIS — Z1283 Encounter for screening for malignant neoplasm of skin: Secondary | ICD-10-CM | POA: Diagnosis not present

## 2023-08-19 DIAGNOSIS — L578 Other skin changes due to chronic exposure to nonionizing radiation: Secondary | ICD-10-CM | POA: Diagnosis not present

## 2023-08-19 DIAGNOSIS — D1801 Hemangioma of skin and subcutaneous tissue: Secondary | ICD-10-CM

## 2023-08-19 DIAGNOSIS — L814 Other melanin hyperpigmentation: Secondary | ICD-10-CM

## 2023-08-19 DIAGNOSIS — L820 Inflamed seborrheic keratosis: Secondary | ICD-10-CM | POA: Diagnosis not present

## 2023-08-19 DIAGNOSIS — I8393 Asymptomatic varicose veins of bilateral lower extremities: Secondary | ICD-10-CM

## 2023-08-19 DIAGNOSIS — L821 Other seborrheic keratosis: Secondary | ICD-10-CM

## 2023-08-19 DIAGNOSIS — Z808 Family history of malignant neoplasm of other organs or systems: Secondary | ICD-10-CM

## 2023-08-19 DIAGNOSIS — I781 Nevus, non-neoplastic: Secondary | ICD-10-CM

## 2023-08-19 DIAGNOSIS — D229 Melanocytic nevi, unspecified: Secondary | ICD-10-CM

## 2023-08-19 NOTE — Patient Instructions (Addendum)

## 2023-08-19 NOTE — Progress Notes (Signed)
Follow-Up Visit   Subjective  Emily Singleton is a 73 y.o. female who presents for the following: Skin Cancer Screening and Full Body Skin Exam. No personal Hx of skin cancer or dysplastic nevi. Son has had MM.   The patient presents for Total-Body Skin Exam (TBSE) for skin cancer screening and mole check. The patient has spots, moles and lesions to be evaluated, some may be new or changing and the patient may have concern these could be cancer.    The following portions of the chart were reviewed this encounter and updated as appropriate: medications, allergies, medical history  Review of Systems:  No other skin or systemic complaints except as noted in HPI or Assessment and Plan.  Objective  Well appearing patient in no apparent distress; mood and affect are within normal limits.  A full examination was performed including scalp, head, eyes, ears, nose, lips, neck, chest, axillae, abdomen, back, buttocks, bilateral upper extremities, bilateral lower extremities, hands, feet, fingers, toes, fingernails, and toenails. All findings within normal limits unless otherwise noted below.   Relevant physical exam findings are noted in the Assessment and Plan.  arms and right hand x4, scalp x1, R scapula x1 (6) Erythematous keratotic or waxy stuck-on papule or plaque.    Assessment & Plan   FAMILY HISTORY OF SKIN CANCER What type(s): MM  Who affected: Son   SKIN CANCER SCREENING PERFORMED TODAY.  ACTINIC DAMAGE - Chronic condition, secondary to cumulative UV/sun exposure - diffuse scaly erythematous macules with underlying dyspigmentation - Recommend daily broad spectrum sunscreen SPF 30+ to sun-exposed areas, reapply every 2 hours as needed.  - Staying in the shade or wearing long sleeves, sun glasses (UVA+UVB protection) and wide brim hats (4-inch brim around the entire circumference of the hat) are also recommended for sun protection.  - Call for new or changing  lesions.  LENTIGINES, SEBORRHEIC KERATOSES, HEMANGIOMAS - Benign normal skin lesions - Benign-appearing - Call for any changes  MELANOCYTIC NEVI - Tan-brown and/or pink-flesh-colored symmetric macules and papules - Benign appearing on exam today - Observation - Call clinic for new or changing moles - Recommend daily use of broad spectrum spf 30+ sunscreen to sun-exposed areas.    Varicose Veins/Spider Veins - Dilated blue, purple or red veins at the lower extremities - Reassured - Smaller vessels can be treated by sclerotherapy (a procedure to inject a medicine into the veins to make them disappear) if desired, but the treatment is not covered by insurance. Larger vessels may be covered if symptomatic and we would refer to vascular surgeon if treatment desired.    Inflamed seborrheic keratosis (6) arms and right hand x4, scalp x1, R scapula x1  Symptomatic, irritating, patient would like treated.  Destruction of lesion - arms and right hand x4, scalp x1, R scapula x1 (6) Complexity: simple   Destruction method: cryotherapy   Informed consent: discussed and consent obtained   Timeout:  patient name, date of birth, surgical site, and procedure verified Lesion destroyed using liquid nitrogen: Yes   Region frozen until ice ball extended beyond lesion: Yes   Outcome: patient tolerated procedure well with no complications   Post-procedure details: wound care instructions given   Additional details:  Prior to procedure, discussed risks of blister formation, small wound, skin dyspigmentation, or rare scar following cryotherapy. Recommend Vaseline ointment to treated areas while healing.    Return in about 1 year (around 08/18/2024) for TBSE.  I, Lawson Radar, CMA, am acting as scribe  for Armida Sans, MD.   Documentation: I have reviewed the above documentation for accuracy and completeness, and I agree with the above.  Armida Sans, MD

## 2023-08-23 ENCOUNTER — Encounter: Payer: Medicare Other | Admitting: Orthopedic Surgery

## 2023-08-25 ENCOUNTER — Inpatient Hospital Stay: Admission: RE | Admit: 2023-08-25 | Payer: Medicare Other | Source: Ambulatory Visit

## 2023-08-26 NOTE — Progress Notes (Signed)
   REFERRING PHYSICIAN:  Marisue Ivan, Md [address] [place],  [place] [address]  DOS: 05/05/23  L3-L4 XLIF and posterior spinal fusion   HISTORY OF PRESENT ILLNESS: Emily Singleton was last seen by me on 07/13/23 with increased right sided LBP. Radiology report read mild compression deformity at T12. We discussed that her pain was not in this area and she was to continue with PT.   She feels like her back pain is better. She still has some increased right sided LBP that is worse with prolonged standing and walking. She is scheduled for left TKA on Monday.   PHYSICAL EXAMINATION:  General: Patient is well developed, well nourished, calm, collected, and in no apparent distress.   NEUROLOGICAL:  General: In no acute distress.   Awake, alert, oriented to person, place, and time.  Pupils equal round and reactive to light.  Facial tone is symmetric.    Well healed incisions. Tenderness in right SI joint/buttock region.   No tenderness at TL junction. No central tenderness over her spine.   Strength:         Side Iliopsoas Quads Hamstring PF DF EHL  R 5 5 5 5 5 5   L 5 5 5 5 5 5    Sensation grossly intact to light touch in bilateral lower extremities.    ROS (Neurologic):  Negative except as noted above  IMAGING: Nothing new to review.   ASSESSMENT/PLAN:  Emily Singleton is feeling better. She still has some intermittent right sided SI joint/buttock pain that is worse with prolonged standing. PT has helped.   We again reviewed her previous xrays and discussed possible mild T12 compression seen on xray report.   Treatment options reviewed with patient and following plan made:   - Will get lumbar xrays with flex/ext to make sure no further change at T12. No tenderness in this area. Will message her with results.  - She can slowly return to activity as tolerated.  - Care with lifting.  - Scheduled for left TKA on Monday.  - Follow up  with repeat xrays in 6 months.   Advised to contact the office if any questions or concerns arise.  I spent a total of 15 minutes in face-to-face and non-face-to-face activities related to this patient's care today including review of outside records, review of imaging, review of symptoms, physical exam, discussion of differential diagnosis, discussion of treatment options, and documentation.   Drake Leach PA-C Department of neurosurgery

## 2023-08-27 ENCOUNTER — Encounter
Admission: RE | Admit: 2023-08-27 | Discharge: 2023-08-27 | Disposition: A | Discharge status: Home / Self Care | Payer: Medicare Other | Source: Ambulatory Visit | Attending: Orthopedic Surgery | Admitting: Orthopedic Surgery

## 2023-08-27 ENCOUNTER — Ambulatory Visit
Admission: RE | Admit: 2023-08-27 | Discharge: 2023-08-27 | Disposition: A | Discharge status: Home / Self Care | Payer: Medicare Other | Source: Ambulatory Visit | Attending: Orthopedic Surgery | Admitting: *Deleted

## 2023-08-27 ENCOUNTER — Encounter: Payer: Self-pay | Admitting: Orthopedic Surgery

## 2023-08-27 ENCOUNTER — Other Ambulatory Visit: Payer: Self-pay

## 2023-08-27 ENCOUNTER — Ambulatory Visit (INDEPENDENT_AMBULATORY_CARE_PROVIDER_SITE_OTHER): Payer: Medicare Other | Admitting: Orthopedic Surgery

## 2023-08-27 VITALS — BP 116/82 | Ht 66.0 in | Wt 225.0 lb

## 2023-08-27 DIAGNOSIS — Z01812 Encounter for preprocedural laboratory examination: Secondary | ICD-10-CM | POA: Insufficient documentation

## 2023-08-27 DIAGNOSIS — Z981 Arthrodesis status: Secondary | ICD-10-CM | POA: Insufficient documentation

## 2023-08-27 DIAGNOSIS — M47816 Spondylosis without myelopathy or radiculopathy, lumbar region: Secondary | ICD-10-CM

## 2023-08-27 DIAGNOSIS — M533 Sacrococcygeal disorders, not elsewhere classified: Secondary | ICD-10-CM

## 2023-08-27 DIAGNOSIS — I700 Atherosclerosis of aorta: Secondary | ICD-10-CM | POA: Insufficient documentation

## 2023-08-27 DIAGNOSIS — E559 Vitamin D deficiency, unspecified: Secondary | ICD-10-CM | POA: Insufficient documentation

## 2023-08-27 HISTORY — DX: Unilateral primary osteoarthritis, left knee: M17.12

## 2023-08-27 LAB — CBC WITH DIFFERENTIAL/PLATELET
Abs Immature Granulocytes: 0.02 10*3/uL (ref 0.00–0.07)
Basophils Absolute: 0.1 10*3/uL (ref 0.0–0.1)
Basophils Relative: 1 %
Eosinophils Absolute: 0.2 10*3/uL (ref 0.0–0.5)
Eosinophils Relative: 2 %
HCT: 44.3 % (ref 36.0–46.0)
Hemoglobin: 14.5 g/dL (ref 12.0–15.0)
Immature Granulocytes: 0 %
Lymphocytes Relative: 33 %
Lymphs Abs: 2.5 10*3/uL (ref 0.7–4.0)
MCH: 28.7 pg (ref 26.0–34.0)
MCHC: 32.7 g/dL (ref 30.0–36.0)
MCV: 87.5 fL (ref 80.0–100.0)
Monocytes Absolute: 0.6 10*3/uL (ref 0.1–1.0)
Monocytes Relative: 8 %
Neutro Abs: 4.1 10*3/uL (ref 1.7–7.7)
Neutrophils Relative %: 56 %
Platelets: 217 10*3/uL (ref 150–400)
RBC: 5.06 MIL/uL (ref 3.87–5.11)
RDW: 13.1 % (ref 11.5–15.5)
WBC: 7.3 10*3/uL (ref 4.0–10.5)
nRBC: 0 % (ref 0.0–0.2)

## 2023-08-27 LAB — URINALYSIS, ROUTINE W REFLEX MICROSCOPIC
Bilirubin Urine: NEGATIVE
Glucose, UA: NEGATIVE mg/dL
Hgb urine dipstick: NEGATIVE
Ketones, ur: NEGATIVE mg/dL
Leukocytes,Ua: NEGATIVE
Nitrite: NEGATIVE
Protein, ur: NEGATIVE mg/dL
Specific Gravity, Urine: 1.023 (ref 1.005–1.030)
pH: 5 (ref 5.0–8.0)

## 2023-08-27 LAB — COMPREHENSIVE METABOLIC PANEL
ALT: 14 U/L (ref 0–44)
AST: 19 U/L (ref 15–41)
Albumin: 4.2 g/dL (ref 3.5–5.0)
Alkaline Phosphatase: 50 U/L (ref 38–126)
Anion gap: 11 (ref 5–15)
BUN: 19 mg/dL (ref 8–23)
CO2: 24 mmol/L (ref 22–32)
Calcium: 9.5 mg/dL (ref 8.9–10.3)
Chloride: 103 mmol/L (ref 98–111)
Creatinine, Ser: 0.72 mg/dL (ref 0.44–1.00)
GFR, Estimated: >60 mL/min (ref >60)
Glucose, Bld: 105 mg/dL — ABNORMAL HIGH (ref 70–99)
Potassium: 3.9 mmol/L (ref 3.5–5.1)
Sodium: 138 mmol/L (ref 135–145)
Total Bilirubin: 0.4 mg/dL (ref <1.2)
Total Protein: 7.3 g/dL (ref 6.5–8.1)

## 2023-08-27 LAB — SURGICAL PCR SCREEN
MRSA, PCR: NEGATIVE
Staphylococcus aureus: NEGATIVE

## 2023-08-27 NOTE — Patient Instructions (Addendum)
Your procedure is scheduled on: Monday, December 23 Report to the Registration Desk on the 1st floor of the [facility] To find out your arrival time, please call [phone] between 1PM - 3PM on: Friday, December 20 If your arrival time is 6:00 am, do not arrive before that time as the [facility] entrance doors do not open until 6:00 am.  REMEMBER: Instructions that are not followed completely may result in serious medical risk, up to and including death; or upon the discretion of your surgeon and anesthesiologist your surgery may need to be rescheduled.  Do not eat food after midnight the night before surgery.  No gum chewing or hard candies.  You may however, drink CLEAR liquids up to 2 hours before you are scheduled to arrive for your surgery. Do not drink anything within 2 hours of your scheduled arrival time.  Clear liquids include: - water  - apple juice without pulp - gatorade (not RED colors) - black coffee or tea (Do NOT add milk or creamers to the coffee or tea) Do NOT drink anything that is not on this list.  In addition, your doctor has ordered for you to drink the provided:  Ensure Pre-Surgery Clear Carbohydrate Drink  Drinking this carbohydrate drink up to two hours before surgery helps to reduce insulin resistance and improve patient outcomes. Please complete drinking 2 hours before scheduled arrival time.  One week prior to surgery: starting December 16 Stop Anti-inflammatories (NSAIDS) such as Advil, Aleve, Ibuprofen, Motrin, Naproxen, Naprosyn and Aspirin based products such as Excedrin, Goody's Powder, BC Powder. Stop ANY OVER THE COUNTER supplements until after surgery.  You may however, continue to take Tylenol if needed for pain up until the day of surgery.  Continue taking all of your other prescription medications up until the day of surgery.  ON THE DAY OF SURGERY ONLY TAKE THESE MEDICATIONS WITH SIPS OF WATER:  Omeprazole (Prilosec)  No Alcohol  for 24 hours before or after surgery.  No Smoking including e-cigarettes for 24 hours before surgery.  No chewable tobacco products for at least 6 hours before surgery.  No nicotine patches on the day of surgery.  Do not use any "recreational" drugs for at least a week (preferably 2 weeks) before your surgery.  Please be advised that the combination of cocaine and anesthesia may have negative outcomes, up to and including death. If you test positive for cocaine, your surgery will be cancelled.  On the morning of surgery brush your teeth with toothpaste and water, you may rinse your mouth with mouthwash if you wish. Do not swallow any toothpaste or mouthwash.  Use CHG Soap as directed on instruction sheet.  Do not wear jewelry, make-up, hairpins, clips or nail polish.  For welded (permanent) jewelry: bracelets, anklets, waist bands, etc.  Please have this removed prior to surgery.  If it is not removed, there is a chance that hospital personnel will need to cut it off on the day of surgery.  Do not wear lotions, powders, or perfumes.   Do not shave body hair from the neck down 48 hours before surgery.  Contact lenses, hearing aids and dentures may not be worn into surgery.  Do not bring valuables to the hospital. [facility] is not responsible for any missing/lost belongings or valuables.   Notify your doctor if there is any change in your medical condition (cold, fever, infection).  Wear comfortable clothing (specific to your surgery type) to the hospital.  After surgery,  you can help prevent lung complications by doing breathing exercises.  Take deep breaths and cough every 1-2 hours. Your doctor may order a device called an Incentive Spirometer to help you take deep breaths.  If you are being admitted to the hospital overnight, leave your suitcase in the car. After surgery it may be brought to your room.  In case of increased patient census, it may be necessary for you, the  patient, to continue your postoperative care in the Same Day Surgery department.  If you are being discharged the day of surgery, you will not be allowed to drive home. You will need a responsible individual to drive you home and stay with you for 24 hours after surgery.   If you are taking public transportation, you will need to have a responsible individual with you.  Please call the Pre-admissions Testing Dept. at [phone] if you have any questions about these instructions.  Surgery Visitation Policy:  Patients having surgery or a procedure may have two visitors.  Children under the age of 47 must have an adult with them who is not the patient.  Inpatient Visitation:    Visiting hours are 7 a.m. to 8 p.m. Up to four visitors are allowed at one time in a patient room. The visitors may rotate out with other people during the day.  One visitor age 65 or older may stay with the patient overnight and must be in the room by 8 p.m.    Pre-operative 5 CHG Bath Instructions   You can play a key role in reducing the risk of infection after surgery. Your skin needs to be as free of germs as possible. You can reduce the number of germs on your skin by washing with CHG (chlorhexidine gluconate) soap before surgery. CHG is an antiseptic soap that kills germs and continues to kill germs even after washing.   DO NOT use if you have an allergy to chlorhexidine/CHG or antibacterial soaps. If your skin becomes reddened or irritated, stop using the CHG and notify one of our RNs at [phone].   Please shower with the CHG soap starting 4 days before surgery using the following schedule:     Please keep in mind the following:  DO NOT shave, including legs and underarms, starting the day of your first shower.   You may shave your face at any point before/day of surgery.  Place clean sheets on your bed the day you start using CHG soap. Use a clean washcloth (not used since being washed) for  each shower. DO NOT sleep with pets once you start using the CHG.   CHG Shower Instructions:  If you choose to wash your hair and private area, wash first with your normal shampoo/soap.  After you use shampoo/soap, rinse your hair and body thoroughly to remove shampoo/soap residue.  Turn the water OFF and apply about 3 tablespoons (45 ml) of CHG soap to a CLEAN washcloth.  Apply CHG soap ONLY FROM YOUR NECK DOWN TO YOUR TOES (washing for 3-5 minutes)  DO NOT use CHG soap on face, private areas, open wounds, or sores.  Pay special attention to the area where your surgery is being performed.  If you are having back surgery, having someone wash your back for you may be helpful. Wait 2 minutes after CHG soap is applied, then you may rinse off the CHG soap.  Pat dry with a clean towel  Put on clean clothes/pajamas   If you choose to  wear lotion, please use ONLY the CHG-compatible lotions on the back of this paper.     Additional instructions for the day of surgery: DO NOT APPLY any lotions, deodorants, cologne, or perfumes.   Put on clean/comfortable clothes.  Brush your teeth.  Ask your nurse before applying any prescription medications to the skin.      CHG Compatible Lotions   Aveeno Moisturizing lotion  Cetaphil Moisturizing Cream  Cetaphil Moisturizing Lotion  Clairol Herbal Essence Moisturizing Lotion, Dry Skin  Clairol Herbal Essence Moisturizing Lotion, Extra Dry Skin  Clairol Herbal Essence Moisturizing Lotion, Normal Skin  Curel Age Defying Therapeutic Moisturizing Lotion with Alpha Hydroxy  Curel Extreme Care Body Lotion  Curel Soothing Hands Moisturizing Hand Lotion  Curel Therapeutic Moisturizing Cream, Fragrance-Free  Curel Therapeutic Moisturizing Lotion, Fragrance-Free  Curel Therapeutic Moisturizing Lotion, Original Formula  Eucerin Daily Replenishing Lotion  Eucerin Dry Skin Therapy Plus Alpha Hydroxy Crme  Eucerin Dry Skin Therapy Plus Alpha Hydroxy Lotion   Eucerin Original Crme  Eucerin Original Lotion  Eucerin Plus Crme Eucerin Plus Lotion  Eucerin TriLipid Replenishing Lotion  Keri Anti-Bacterial Hand Lotion  Keri Deep Conditioning Original Lotion Dry Skin Formula Softly Scented  Keri Deep Conditioning Original Lotion, Fragrance Free Sensitive Skin Formula  Keri Lotion Fast Absorbing Fragrance Free Sensitive Skin Formula  Keri Lotion Fast Absorbing Softly Scented Dry Skin Formula  Keri Original Lotion  Keri Skin Renewal Lotion Keri Silky Smooth Lotion  Keri Silky Smooth Sensitive Skin Lotion  Nivea Body Creamy Conditioning Oil  Nivea Body Extra Enriched Lotion  Nivea Body Original Lotion  Nivea Body Sheer Moisturizing Lotion Nivea Crme  Nivea Skin Firming Lotion  NutraDerm 30 Skin Lotion  NutraDerm Skin Lotion  NutraDerm Therapeutic Skin Cream  NutraDerm Therapeutic Skin Lotion  ProShield Protective Hand Cream  Provon moisturizing lotion    Preoperative Educational Videos for Total Hip, Knee and Shoulder Replacements  To better prepare for surgery, please view our videos that explain the physical activity and discharge planning required to have the best surgical recovery at [facility].  [email or web address]  Questions? Call [phone] or email [email or web address]

## 2023-09-05 MED ORDER — LACTATED RINGERS IV SOLN
INTRAVENOUS | Status: DC
Start: 2023-09-05 — End: 2023-09-06

## 2023-09-05 MED ORDER — CHLORHEXIDINE GLUCONATE 0.12 % MT SOLN
15.0000 mL | Freq: Once | OROMUCOSAL | Status: AC
Start: 2023-09-05 — End: 2023-09-06
  Administered 2023-09-06: 15 mL via OROMUCOSAL

## 2023-09-05 MED ORDER — ORAL CARE MOUTH RINSE
15.0000 mL | Freq: Once | OROMUCOSAL | Status: AC
Start: 2023-09-05 — End: 2023-09-06

## 2023-09-05 MED ORDER — CEFAZOLIN SODIUM-DEXTROSE 2-4 GM/100ML-% IV SOLN
2.0000 g | INTRAVENOUS | Status: AC
  Administered 2023-09-06: 2 g via INTRAVENOUS

## 2023-09-05 MED ORDER — TRANEXAMIC ACID-NACL 1000-0.7 MG/100ML-% IV SOLN: 1000.0000 mg | INTRAVENOUS | Status: DC

## 2023-09-05 MED ORDER — DEXAMETHASONE SODIUM PHOSPHATE 10 MG/ML IJ SOLN
8.0000 mg | Freq: Once | INTRAMUSCULAR | Status: AC
  Administered 2023-09-06: 8 mg via INTRAVENOUS

## 2023-09-06 ENCOUNTER — Other Ambulatory Visit: Payer: Self-pay

## 2023-09-06 ENCOUNTER — Encounter
Admission: RE | Disposition: A | Discharge status: Home Health | Payer: Self-pay | Source: Home / Self Care | Attending: Orthopedic Surgery

## 2023-09-06 ENCOUNTER — Ambulatory Visit: Payer: Medicare Other

## 2023-09-06 ENCOUNTER — Observation Stay
Admission: RE | Admit: 2023-09-06 | Discharge: 2023-09-07 | Disposition: A | Discharge status: Home Health | Payer: Medicare Other | Attending: Orthopedic Surgery | Admitting: Orthopedic Surgery

## 2023-09-06 ENCOUNTER — Ambulatory Visit: Payer: Medicare Other | Admitting: Urgent Care

## 2023-09-06 ENCOUNTER — Encounter: Payer: Self-pay | Admitting: Orthopedic Surgery

## 2023-09-06 ENCOUNTER — Ambulatory Visit: Payer: Medicare Other | Admitting: Registered Nurse

## 2023-09-06 DIAGNOSIS — M1712 Unilateral primary osteoarthritis, left knee: Principal | ICD-10-CM | POA: Insufficient documentation

## 2023-09-06 DIAGNOSIS — Z96652 Presence of left artificial knee joint: Secondary | ICD-10-CM

## 2023-09-06 DIAGNOSIS — F32A Depression, unspecified: Secondary | ICD-10-CM | POA: Insufficient documentation

## 2023-09-06 DIAGNOSIS — F419 Anxiety disorder, unspecified: Secondary | ICD-10-CM | POA: Insufficient documentation

## 2023-09-06 DIAGNOSIS — Z01812 Encounter for preprocedural laboratory examination: Principal | ICD-10-CM

## 2023-09-06 DIAGNOSIS — E669 Obesity, unspecified: Secondary | ICD-10-CM | POA: Insufficient documentation

## 2023-09-06 DIAGNOSIS — Z6836 Body mass index (BMI) 36.0-36.9, adult: Secondary | ICD-10-CM | POA: Insufficient documentation

## 2023-09-06 DIAGNOSIS — K219 Gastro-esophageal reflux disease without esophagitis: Secondary | ICD-10-CM | POA: Insufficient documentation

## 2023-09-06 DIAGNOSIS — Z87891 Personal history of nicotine dependence: Secondary | ICD-10-CM | POA: Insufficient documentation

## 2023-09-06 HISTORY — PX: TOTAL KNEE ARTHROPLASTY: SHX125

## 2023-09-06 SURGERY — ARTHROPLASTY, KNEE, TOTAL
Anesthesia: Monitor Anesthesia Care | Site: Knee | Laterality: Left

## 2023-09-06 MED ORDER — OXYCODONE HCL 5 MG PO TABS: 5.0000 mg | ORAL_TABLET | Freq: Once | ORAL | Status: DC | PRN

## 2023-09-06 MED ORDER — PHENOL 1.4 % MT LIQD: 1.0000 | OROMUCOSAL | Status: DC | PRN

## 2023-09-06 MED ORDER — PROPOFOL 10 MG/ML IV BOLUS
INTRAVENOUS | Status: AC
  Filled 2023-09-06: qty 20

## 2023-09-06 MED ORDER — OXYCODONE HCL 5 MG/5ML PO SOLN: 5.0000 mg | Freq: Once | ORAL | Status: DC | PRN

## 2023-09-06 MED ORDER — SODIUM CHLORIDE 0.9 % IV SOLN: INTRAVENOUS | Status: DC

## 2023-09-06 MED ORDER — BUPIVACAINE HCL (PF) 0.5 % IJ SOLN
INTRAMUSCULAR | Status: DC | PRN
  Administered 2023-09-06: 2.5 mL via INTRATHECAL

## 2023-09-06 MED ORDER — PHENYLEPHRINE HCL-NACL 20-0.9 MG/250ML-% IV SOLN
INTRAVENOUS | Status: DC | PRN
  Administered 2023-09-06: 20 ug/min via INTRAVENOUS

## 2023-09-06 MED ORDER — CEFAZOLIN SODIUM-DEXTROSE 2-4 GM/100ML-% IV SOLN
2.0000 g | Freq: Four times a day (QID) | INTRAVENOUS | Status: AC
  Administered 2023-09-06 (×2): 2 g via INTRAVENOUS

## 2023-09-06 MED ORDER — CEFAZOLIN SODIUM-DEXTROSE 2-4 GM/100ML-% IV SOLN
INTRAVENOUS | Status: AC
  Filled 2023-09-06: qty 100

## 2023-09-06 MED ORDER — KETOROLAC TROMETHAMINE 15 MG/ML IJ SOLN
INTRAMUSCULAR | Status: AC
  Filled 2023-09-06: qty 1

## 2023-09-06 MED ORDER — MIDAZOLAM HCL 5 MG/5ML IJ SOLN
INTRAMUSCULAR | Status: DC | PRN
  Administered 2023-09-06: 2 mg via INTRAVENOUS

## 2023-09-06 MED ORDER — SURGIPHOR WOUND IRRIGATION SYSTEM - OPTIME: TOPICAL | Status: DC | PRN

## 2023-09-06 MED ORDER — ACETAMINOPHEN 500 MG PO TABS
1000.0000 mg | ORAL_TABLET | Freq: Three times a day (TID) | ORAL | Status: DC
  Administered 2023-09-06 – 2023-09-07 (×2): 1000 mg via ORAL

## 2023-09-06 MED ORDER — PANTOPRAZOLE SODIUM 40 MG PO TBEC
40.0000 mg | DELAYED_RELEASE_TABLET | Freq: Every day | ORAL | Status: DC
  Administered 2023-09-06 – 2023-09-07 (×2): 40 mg via ORAL

## 2023-09-06 MED ORDER — COLESTIPOL HCL 1 G PO TABS
1.0000 g | ORAL_TABLET | Freq: Two times a day (BID) | ORAL | Status: DC
  Administered 2023-09-06 – 2023-09-07 (×2): 1 g via ORAL
  Filled 2023-09-06 (×2): qty 1

## 2023-09-06 MED ORDER — CHLORHEXIDINE GLUCONATE 0.12 % MT SOLN
OROMUCOSAL | Status: AC
Start: 2023-09-06 — End: ?
  Filled 2023-09-06: qty 15

## 2023-09-06 MED ORDER — DOCUSATE SODIUM 100 MG PO CAPS
ORAL_CAPSULE | ORAL | Status: AC
  Filled 2023-09-06: qty 1

## 2023-09-06 MED ORDER — SODIUM CHLORIDE 0.9 % IR SOLN
Status: DC | PRN
  Administered 2023-09-06: 3000 mL

## 2023-09-06 MED ORDER — PROPOFOL 10 MG/ML IV BOLUS
INTRAVENOUS | Status: DC | PRN
  Administered 2023-09-06: 10 mg via INTRAVENOUS
  Administered 2023-09-06: 40 mg via INTRAVENOUS
  Administered 2023-09-06: 30 mg via INTRAVENOUS
  Administered 2023-09-06: 20 mg via INTRAVENOUS

## 2023-09-06 MED ORDER — ACETAMINOPHEN 10 MG/ML IV SOLN: 1000.0000 mg | Freq: Once | INTRAVENOUS | Status: DC | PRN

## 2023-09-06 MED ORDER — PROPOFOL 500 MG/50ML IV EMUL
INTRAVENOUS | Status: DC | PRN
  Administered 2023-09-06: 75 ug/kg/min via INTRAVENOUS

## 2023-09-06 MED ORDER — ACETAMINOPHEN 10 MG/ML IV SOLN
INTRAVENOUS | Status: AC
  Filled 2023-09-06: qty 100

## 2023-09-06 MED ORDER — TRANEXAMIC ACID-NACL 1000-0.7 MG/100ML-% IV SOLN
INTRAVENOUS | Status: AC
  Filled 2023-09-06: qty 100

## 2023-09-06 MED ORDER — PANTOPRAZOLE SODIUM 40 MG PO TBEC
DELAYED_RELEASE_TABLET | ORAL | Status: AC
  Filled 2023-09-06: qty 1

## 2023-09-06 MED ORDER — MORPHINE SULFATE (PF) 4 MG/ML IV SOLN: 0.5000 mg | INTRAVENOUS | Status: DC | PRN

## 2023-09-06 MED ORDER — ACETAMINOPHEN 500 MG PO TABS
ORAL_TABLET | ORAL | Status: AC
  Filled 2023-09-06: qty 2

## 2023-09-06 MED ORDER — BUPIVACAINE HCL (PF) 0.5 % IJ SOLN
INTRAMUSCULAR | Status: AC
  Filled 2023-09-06: qty 10

## 2023-09-06 MED ORDER — FENTANYL CITRATE (PF) 100 MCG/2ML IJ SOLN
INTRAMUSCULAR | Status: DC | PRN
  Administered 2023-09-06: 25 ug via INTRAVENOUS

## 2023-09-06 MED ORDER — SODIUM CHLORIDE (PF) 0.9 % IJ SOLN
INTRAMUSCULAR | Status: DC | PRN
  Administered 2023-09-06: 71 mL via INTRAMUSCULAR

## 2023-09-06 MED ORDER — FENTANYL CITRATE (PF) 100 MCG/2ML IJ SOLN
INTRAMUSCULAR | Status: AC
  Filled 2023-09-06: qty 2

## 2023-09-06 MED ORDER — TRAMADOL HCL 50 MG PO TABS
50.0000 mg | ORAL_TABLET | Freq: Four times a day (QID) | ORAL | Status: DC | PRN
  Administered 2023-09-06: 50 mg via ORAL

## 2023-09-06 MED ORDER — METOCLOPRAMIDE HCL 5 MG/ML IJ SOLN: 5.0000 mg | Freq: Three times a day (TID) | INTRAMUSCULAR | Status: DC | PRN

## 2023-09-06 MED ORDER — KETOROLAC TROMETHAMINE 15 MG/ML IJ SOLN
7.5000 mg | Freq: Four times a day (QID) | INTRAMUSCULAR | Status: AC
  Administered 2023-09-06 – 2023-09-07 (×4): 7.5 mg via INTRAVENOUS

## 2023-09-06 MED ORDER — DOCUSATE SODIUM 100 MG PO CAPS
100.0000 mg | ORAL_CAPSULE | Freq: Two times a day (BID) | ORAL | Status: DC
  Administered 2023-09-07: 100 mg via ORAL

## 2023-09-06 MED ORDER — TRAMADOL HCL 50 MG PO TABS
ORAL_TABLET | ORAL | Status: AC
  Filled 2023-09-06: qty 1

## 2023-09-06 MED ORDER — FENTANYL CITRATE (PF) 100 MCG/2ML IJ SOLN: 25.0000 ug | INTRAMUSCULAR | Status: DC | PRN

## 2023-09-06 MED ORDER — ENOXAPARIN SODIUM 30 MG/0.3ML IJ SOSY
30.0000 mg | PREFILLED_SYRINGE | Freq: Two times a day (BID) | INTRAMUSCULAR | Status: DC
Start: 2023-09-07 — End: 2023-09-07
  Administered 2023-09-07: 30 mg via SUBCUTANEOUS

## 2023-09-06 MED ORDER — DROPERIDOL 2.5 MG/ML IJ SOLN: 0.6250 mg | Freq: Once | INTRAMUSCULAR | Status: DC | PRN

## 2023-09-06 MED ORDER — PROPOFOL 1000 MG/100ML IV EMUL
INTRAVENOUS | Status: AC
  Filled 2023-09-06: qty 100

## 2023-09-06 MED ORDER — ACETAMINOPHEN 10 MG/ML IV SOLN
INTRAVENOUS | Status: DC | PRN
  Administered 2023-09-06: 1000 mg via INTRAVENOUS

## 2023-09-06 MED ORDER — ONDANSETRON HCL 4 MG PO TABS: 4.0000 mg | ORAL_TABLET | Freq: Four times a day (QID) | ORAL | Status: DC | PRN

## 2023-09-06 MED ORDER — HYDROCODONE-ACETAMINOPHEN 5-325 MG PO TABS
ORAL_TABLET | ORAL | Status: AC
  Filled 2023-09-06: qty 1

## 2023-09-06 MED ORDER — ORAL CARE MOUTH RINSE: 15.0000 mL | OROMUCOSAL | Status: DC | PRN

## 2023-09-06 MED ORDER — ACETAMINOPHEN 325 MG PO TABS: 325.0000 mg | ORAL_TABLET | Freq: Four times a day (QID) | ORAL | Status: DC | PRN

## 2023-09-06 MED ORDER — MENTHOL 3 MG MT LOZG: 1.0000 | LOZENGE | OROMUCOSAL | Status: DC | PRN

## 2023-09-06 MED ORDER — ONDANSETRON HCL 4 MG/2ML IJ SOLN: 4.0000 mg | Freq: Four times a day (QID) | INTRAMUSCULAR | Status: DC | PRN

## 2023-09-06 MED ORDER — TRANEXAMIC ACID-NACL 1000-0.7 MG/100ML-% IV SOLN
INTRAVENOUS | Status: DC | PRN
  Administered 2023-09-06 (×2): 1000 mg via INTRAVENOUS

## 2023-09-06 MED ORDER — ONDANSETRON HCL 4 MG/2ML IJ SOLN
INTRAMUSCULAR | Status: AC
  Filled 2023-09-06: qty 2

## 2023-09-06 MED ORDER — MIDAZOLAM HCL 2 MG/2ML IJ SOLN
INTRAMUSCULAR | Status: AC
  Filled 2023-09-06: qty 2

## 2023-09-06 MED ORDER — METOCLOPRAMIDE HCL 5 MG PO TABS
5.0000 mg | ORAL_TABLET | Freq: Three times a day (TID) | ORAL | Status: DC | PRN
Start: 2023-09-06 — End: 2023-09-07

## 2023-09-06 MED ORDER — HYDROCODONE-ACETAMINOPHEN 5-325 MG PO TABS
1.0000 | ORAL_TABLET | ORAL | Status: DC | PRN
  Administered 2023-09-06 – 2023-09-07 (×2): 1 via ORAL

## 2023-09-06 MED ORDER — DEXAMETHASONE SODIUM PHOSPHATE 10 MG/ML IJ SOLN
INTRAMUSCULAR | Status: AC
  Filled 2023-09-06: qty 1

## 2023-09-06 SURGICAL SUPPLY — 69 items
BLADE PATELLA REAM PILOT HOLE (MISCELLANEOUS) IMPLANT
BLADE SAGITTAL AGGR TOOTH XLG (BLADE) IMPLANT
BLADE SAW SAG 25X90X1.19 (BLADE) ×1 IMPLANT
BLADE SAW SAG 29X58X.64 (BLADE) ×1 IMPLANT
BNDG ELASTIC 6INX 5YD STR LF (GAUZE/BANDAGES/DRESSINGS) ×1 IMPLANT
BOWL CEMENT MIX W/ADAPTER (MISCELLANEOUS) ×1 IMPLANT
CEMENT BONE R 1X40 (Cement) ×2 IMPLANT
CHLORAPREP W/TINT 26 (MISCELLANEOUS) ×2 IMPLANT
COOLER POLAR GLACIER W/PUMP (MISCELLANEOUS) ×1 IMPLANT
CUFF TRNQT CYL 24X4X16.5-23 (TOURNIQUET CUFF) IMPLANT
CUFF TRNQT CYL 30X4X21-28X (TOURNIQUET CUFF) IMPLANT
CUFF TRNQT CYL 34X4X40X1 (TOURNIQUET CUFF) IMPLANT
DERMABOND ADVANCED .7 DNX12 (GAUZE/BANDAGES/DRESSINGS) ×1 IMPLANT
DRAPE INCISE IOBAN 66X60 STRL (DRAPES) IMPLANT
DRAPE SHEET LG 3/4 BI-LAMINATE (DRAPES) ×1 IMPLANT
DRSG MEPILEX SACRM 8.7X9.8 (GAUZE/BANDAGES/DRESSINGS) ×1 IMPLANT
DRSG OPSITE POSTOP 4X10 (GAUZE/BANDAGES/DRESSINGS) IMPLANT
DRSG OPSITE POSTOP 4X8 (GAUZE/BANDAGES/DRESSINGS) IMPLANT
ELECT REM PT RETURN 9FT ADLT (ELECTROSURGICAL) ×1
ELECTRODE REM PT RTRN 9FT ADLT (ELECTROSURGICAL) ×1 IMPLANT
GLOVE BIO SURGEON STRL SZ8 (GLOVE) ×1 IMPLANT
GLOVE BIOGEL PI IND STRL 8 (GLOVE) ×1 IMPLANT
GLOVE PI ORTHO PRO STRL 7.5 (GLOVE) ×2 IMPLANT
GLOVE PI ORTHO PRO STRL SZ8 (GLOVE) ×2 IMPLANT
GLOVE SURG SYN 7.5 E (GLOVE) ×1 IMPLANT
GLOVE SURG SYN 7.5 PF PI (GLOVE) ×1 IMPLANT
GOWN SRG XL LVL 3 NONREINFORCE (GOWNS) ×1 IMPLANT
GOWN STRL REUS W/ TWL LRG LVL3 (GOWN DISPOSABLE) ×1 IMPLANT
GOWN STRL REUS W/ TWL XL LVL3 (GOWN DISPOSABLE) ×1 IMPLANT
HOOD PEEL AWAY T7 (MISCELLANEOUS) ×2 IMPLANT
IV NS IRRIG 3000ML ARTHROMATIC (IV SOLUTION) ×1 IMPLANT
KIT TURNOVER KIT A (KITS) ×1 IMPLANT
MANIFOLD NEPTUNE II (INSTRUMENTS) ×1 IMPLANT
MARKER SKIN DUAL TIP RULER LAB (MISCELLANEOUS) ×1 IMPLANT
MAT ABSORB FLUID 56X50 GRAY (MISCELLANEOUS) ×1 IMPLANT
NDL HYPO 21X1.5 SAFETY (NEEDLE) ×1 IMPLANT
NEEDLE HYPO 21X1.5 SAFETY (NEEDLE) ×1 IMPLANT
PACK TOTAL KNEE (MISCELLANEOUS) ×1 IMPLANT
PAD ARMBOARD 7.5X6 YLW CONV (MISCELLANEOUS) ×3 IMPLANT
PAD WRAPON POLAR KNEE (MISCELLANEOUS) ×1 IMPLANT
PENCIL SMOKE EVACUATOR (MISCELLANEOUS) ×1 IMPLANT
PIN DRILL HDLS TROCAR 75 4PK (PIN) IMPLANT
PSN FEM CR CMT CCR STD SZ10 L (Joint) ×1 IMPLANT
PULSAVAC PLUS IRRIG FAN TIP (DISPOSABLE) ×1
SCREW FEMALE HEX FIX 25X2.5 (ORTHOPEDIC DISPOSABLE SUPPLIES) IMPLANT
SCREW HEX HEADED 3.5X27 DISP (ORTHOPEDIC DISPOSABLE SUPPLIES) IMPLANT
SLEEVE SCD COMPRESS KNEE MED (STOCKING) ×1 IMPLANT
SOLUTION IRRIG SURGIPHOR (IV SOLUTION) ×1 IMPLANT
STEM POLY PAT PLY 35M KNEE (Knees) IMPLANT
STEM TIB ST PERS 14+30 (Stem) IMPLANT
STEM TIBIA 5 DEG SZ F L KNEE (Knees) IMPLANT
STEM TIBIAL 10 8-11 EF POLY LT (Joint) IMPLANT
SURFACE ARTC PRSNA CCR SZ10 L (Joint) IMPLANT
SUT STRATA 1 CT-1 DLB (SUTURE) ×1
SUT STRATAFIX 14 PDO 48 VLT (SUTURE) ×1 IMPLANT
SUT STRATAFIX PDO 1 14 VIOLET (SUTURE) ×1
SUT VIC AB 0 CT1 36 (SUTURE) ×1 IMPLANT
SUT VIC AB 2-0 CT2 27 (SUTURE) ×2 IMPLANT
SUT VICRYL 1-0 27IN ABS (SUTURE) ×1
SUTURE STRATA SPIR 4-0 18 (SUTURE) ×1 IMPLANT
SUTURE VICRYL 1-0 27IN ABS (SUTURE) ×1 IMPLANT
SYR 30ML LL (SYRINGE) ×2 IMPLANT
TAPE CLOTH 3X10 WHT NS LF (GAUZE/BANDAGES/DRESSINGS) ×1 IMPLANT
TIBIA STEM 5 DEG SZ F L KNEE (Knees) ×1 IMPLANT
TIP FAN IRRIG PULSAVAC PLUS (DISPOSABLE) ×1 IMPLANT
TOWEL OR 17X26 4PK STRL BLUE (TOWEL DISPOSABLE) IMPLANT
TRAP FLUID SMOKE EVACUATOR (MISCELLANEOUS) ×1 IMPLANT
WATER STERILE IRR 1000ML POUR (IV SOLUTION) ×1 IMPLANT
WRAPON POLAR PAD KNEE (MISCELLANEOUS) ×1

## 2023-09-06 NOTE — Anesthesia Preprocedure Evaluation (Signed)
Anesthesia Evaluation  Patient identified by MRN, date of birth, ID band Patient awake    Reviewed: Allergy & Precautions, H&P , NPO status , Patient's Chart, lab work & pertinent test results, reviewed documented beta blocker date and time   Airway Mallampati: II   Neck ROM: full    Dental  (+) Poor Dentition   Pulmonary neg pulmonary ROS, former smoker   Pulmonary exam normal        Cardiovascular Exercise Tolerance: Good negative cardio ROS Normal cardiovascular exam Rhythm:regular Rate:Normal     Neuro/Psych   Anxiety Depression    negative neurological ROS  negative psych ROS   GI/Hepatic Neg liver ROS,GERD  Medicated,,  Endo/Other    Class 3 obesity  Renal/GU negative Renal ROS  negative genitourinary   Musculoskeletal   Abdominal   Peds  Hematology negative hematology ROS (+)   Anesthesia Other Findings Past Medical History: No date: Aortic atherosclerosis (HCC) No date: DDD (degenerative disc disease), lumbar No date: Depression No date: Diverticulosis No date: Endometriosis No date: Generalized anxiety disorder No date: GERD (gastroesophageal reflux disease)     Comment:  possible eosinophilic esophagitis on egd No date: IBS (irritable bowel syndrome) No date: Obesity (BMI 35.0-39.9 without comorbidity) No date: Osteoarthritis No date: Osteoarthritis of left knee No date: Osteopenia No date: Spinal stenosis, lumbar No date: Vitamin D deficiency Past Surgical History: 1980: ABDOMINAL HYSTERECTOMY     Comment:  partial 05/05/2023: ANTERIOR LATERAL LUMBAR FUSION WITH PERCUTANEOUS SCREW 1  LEVEL; N/A     Comment:  Procedure: L3-4 LATERAL LUMBAR INTERBODY FUSION AND               POSTERIOR SPINAL FUSION;  Surgeon: Venetia Night,               MD;  Location: [facility] ORS;  Service: Neurosurgery;                Laterality: N/A; 05/05/2023: APPLICATION OF INTRAOPERATIVE CT SCAN; N/A     Comment:   Procedure: APPLICATION OF INTRAOPERATIVE CT SCAN;                Surgeon: Venetia Night, MD;  Location: [facility] ORS;                Service: Neurosurgery;  Laterality: N/A; No date: BUNIONECTOMY; Bilateral 10/25/2019: COLONOSCOPY 07/03/2014: COLONOSCOPY WITH ESOPHAGOGASTRODUODENOSCOPY (EGD) No date: KNEE ARTHROSCOPY; Bilateral 1989: REDUCTION MAMMAPLASTY; Bilateral 1955: TONSILLECTOMY BMI    Body Mass Index: 36.96 kg/m     Reproductive/Obstetrics negative OB ROS                             Anesthesia Physical Anesthesia Plan  ASA: 3  Anesthesia Plan: Spinal   Post-op Pain Management:    Induction:   PONV Risk Score and Plan: 3  Airway Management Planned:   Additional Equipment:   Intra-op Plan:   Post-operative Plan:   Informed Consent: I have reviewed the patients History and Physical, chart, labs and discussed the procedure including the risks, benefits and alternatives for the proposed anesthesia with the patient or authorized representative who has indicated his/her understanding and acceptance.     Dental Advisory Given  Plan Discussed with: CRNA  Anesthesia Plan Comments:        Anesthesia Quick Evaluation

## 2023-09-06 NOTE — Anesthesia Procedure Notes (Addendum)
Spinal  Patient location during procedure: OR Start time: 09/06/2023 10:14 AM End time: 09/06/2023 10:17 AM Reason for block: surgical anesthesia Staffing Performed: resident/CRNA  Resident/CRNA: Lily Lovings, CRNA Performed by: Lily Lovings, CRNA Authorized by: Yevette Edwards, MD   Preanesthetic Checklist Completed: patient identified, IV checked, site marked, risks and benefits discussed, surgical consent, monitors and equipment checked, pre-op evaluation and timeout performed Spinal Block Patient position: sitting Prep: Betadine Patient monitoring: heart rate, continuous pulse ox, blood pressure and cardiac monitor Approach: midline Location: L4-5 Injection technique: single-shot Needle Needle type: Introducer and Pencan  Needle gauge: 24 G Needle length: 9 cm Assessment Events: CSF return Additional Notes Negative paresthesia. Negative blood return. Positive free-flowing CSF. Expiration date of kit checked and confirmed. Patient tolerated procedure well, without complications.

## 2023-09-06 NOTE — H&P (Signed)
History of Present Illness: The patient is an 72 y.o. female seen in clinic today for history and physical for left total knee arthroplasty with Dr. Audelia Acton on 09/06/2023. Patient has had pain over the medial and anterior aspect of the left knee for several years that has been increasing to the point of interfering with her quality of life and activities of daily living. Over the years she is undergoing cortisone injections, Zilretta injections as well as gel shots with initially some relief but recently no improvement. Her pain is 10 out of 10 along the medial joint line she describes sharp stabbing pain. She has dull aching pain with rest. Pain is constant and interfering with her quality of life and activities a living. Patient is seen Dr. Audelia Acton discussed total knee arthroplasty and agreed and consented the procedure.  The patient is a non-smoker, nondiabetic with a remote history A1c of 5.8 and a BMI of 36.9. She denies any history of metal allergy.  Past Medical History: Past Medical History:  Diagnosis Date  Claustrophobia  Depression  GERD (gastroesophageal reflux disease)  Obesity (BMI 30-39.9)  Osteoarthritis   Past Surgical History: Past Surgical History:  Procedure Laterality Date  TONSILLECTOMY 1955  COLONOSCOPY 07/03/2014  No Polyps & FH CCA 1 degree relative <60 yo - repeat 5 years per Dr. Shelle Iron  EGD 07/03/2014  Esophagitis - no repeat per Dr. Shelle Iron  COLONOSCOPY 10/25/2019  Diverticulosis/FHx CC/Repeat 51yrs/TKT  B/L Knee Surgery  Bunionectomy Bilateral  BUNIONETTE EXCISION  COLONOSCOPY 2004 ?  [place] - no polyps per pt.  HYSTERECTOMY  Partial hysterectomy  KNEE ARTHROSCOPY  LAPAROSCOPIC HYSTERECTOMY SUPRACERVICAL   Past Family History: Family History  Problem Relation Age of Onset  Diabetes type II Mother  Arthritis Mother  Basal cell carcinoma Father  High blood pressure (Hypertension) Sister  COPD Sister  Colon cancer Sister  High  blood pressure (Hypertension) Sister  Colon cancer Sister  Colon cancer Sister  High blood pressure (Hypertension) Sister  Colon polyps Sister   Medications: Current Outpatient Medications  Medication Sig Dispense Refill  calcium carbonate-vitamin D3 (CALTRATE 600+D) 600 mg(1,500mg ) -400 unit tablet Take 1 tablet by mouth 2 (two) times daily with meals  colestipoL (COLESTID) 1 gram tablet Take 1 tablet (1 g total) by mouth once daily 90 tablet 3  omeprazole (PRILOSEC) 20 MG DR capsule TAKE 1 CAPSULE (20 MG TOTAL) BY MOUTH ONCE DAILY AS NEEDED 90 capsule 1   No current facility-administered medications for this visit.   Allergies: No Known Allergies   Visit Vitals: Vitals:  08/24/23 0922  BP: (!) 138/92    Review of Systems:  A comprehensive 14 point ROS was performed, reviewed, and the pertinent orthopaedic findings are documented in the HPI.  Physical Exam: General:  Well developed, well nourished, no apparent distress, normal affect, normal gait with no antalgic component.   HEENT: Head normocephalic, atraumatic, PERRL.   Abdomen: Soft, non tender, non distended, Bowel sounds present.  Heart: Examination of the heart reveals regular, rate, and rhythm. There is no murmur noted on ascultation. There is a normal apical pulse.  Lungs: Lungs are clear to auscultation. There is no wheeze, rhonchi, or crackles. There is normal expansion of bilateral chest walls.   Comprehensive Knee Exam: Gait Stiff gait  Alignment Neutral   Inspection Left  Skin Normal appearance with no obvious deformity. No ecchymosis or erythema.  Soft Tissue No focal soft tissue swelling  Quad Atrophy None   Palpation  Left  Tenderness Medial joint line tenderness to palpation.  Crepitus + patellofemoral and tibiofemoral crepitus  Effusion None   Range of Motion Left  Flexion 0-105  Extension Full knee extension without hyperextension   Ligamentous Exam Left  Lachman Normal  Valgus 0  Normal  Valgus 30 Normal  Varus 0 Normal  Varus 30 Normal  Anterior Drawer Normal  Posterior Drawer Normal   Meniscal Exam Left  Hyperflexion Test Positive  Hyperextension Test Positive  McMurray's Negative   Neurovascular Left  Quadriceps Strength 5/5  Hamstring Strength 5/5  Hip Abductor Strength 4/5  Distal Motor Normal  Distal Sensory Normal light touch sensation  Distal Pulses Normal    Imaging Studies: I have reviewed AP, lateral,sunrise, and flexed PA weight bearing knee X-rays (4 views) of the left knee from the clinic on 07/16/2023 show moderate/severe degenerative changes with medial and patellofemoral joint space narrowing with medial bone-on-bone articulation, osteophyte formation, subchondral cysts and sclerosis. AP, sunrise, and flexed PA of the right knee also show moderate patellofemoral changes and medial joint space narrowing with osteophyte formation and sclerosis. Left knee is Kellgren-Lawrence grade 4 right knee is Kellgren-Lawrence grade 3. no fractures or dislocations noted in either knee.   Assessment:  ICD-10-CM  1. Primary osteoarthritis of left knee M17.12  Left knee osteoarthritis   Plan: Danyah is a 72 year old female who presents for history and physical for left total knee arthroplasty with Dr. Audelia Acton on 09/06/2023. She has advanced left knee degenerative arthritis with complete loss of joint space in the medial compartment with varus deformity. She has had no relief with conservative treatment and pain is interfering with her quality of life and activities a living. Risks, benefits, complications of a left total knee arthroplasty have been discussed with the patient. Patient has agreed to consent procedure with Dr. Audelia Acton on 09/06/2023.  The hospitalization and post-operative care and rehabilitation were also discussed. The use of perioperative antibiotics and DVT prophylaxis were discussed. The risk, benefits and alternatives to a surgical  intervention were discussed at length with the patient. The patient was also advised of risks related to the medical comorbidities and elevated body mass index (BMI). A lengthy discussion took place to review the most common complications including but not limited to: stiffness, loss of function, complex regional pain syndrome, deep vein thrombosis, pulmonary embolus, heart attack, stroke, infection, wound breakdown, numbness, intraoperative fracture, damage to nerves, tendon,muscles, arteries or other blood vessels, death and other possible complications from anesthesia. The patient was told that we will take steps to minimize these risks by using sterile technique, antibiotics and DVT prophylaxis when appropriate and follow the patient postoperatively in the office setting to monitor progress. The possibility of recurrent pain, no improvement in pain and actual worsening of pain were also discussed with the patient.   All questions answered patient agrees with above plan for left total knee arthroplasty.

## 2023-09-06 NOTE — Plan of Care (Signed)
Pain medication and interventions discussed with patient, patient verbalized understanding

## 2023-09-06 NOTE — Evaluation (Signed)
Physical Therapy Evaluation Patient Details Name: Emily Singleton MRN: [id number] DOB: [birth year] Today's Date: 09/06/2023  History of Present Illness  Pt is a 72 y.o. female s/p L TKA secondary to OA 09/06/23.  PMH includes anxiety, IBS, Vitamin D deficiency, anterior lateral lumbar fusion 05/05/23, claustrophobia.  Clinical Impression  Prior to surgery, pt was independent with ambulation (limited d/t L knee pain); lives alone in 1 level home with 3 STE with railing; pt's son and daughter in law lives next door and can assist intermittently.  L knee pain 4/10 at rest beginning of session and 6/10 at rest end of session (nurse notified).  L knee flexion AROM to 85 degrees.  Able to perform L LE SLR with minimal assist.  Currently pt is min assist semi-supine to sitting EOB; min assist progressing to CGA with transfers using RW; and CGA to ambulate 15 feet x2 (to/from bathroom) with RW use.  Pt would currently benefit from skilled PT to address noted impairments and functional limitations (see below for any additional details).  Upon hospital discharge, pt would benefit from ongoing therapy.     If plan is discharge home, recommend the following: A little help with walking and/or transfers;A little help with bathing/dressing/bathroom;Assistance with cooking/housework;Assist for transportation;Help with stairs or ramp for entrance   Can travel by private vehicle    Yes    Equipment Recommendations Rolling walker (2 wheels);BSC/3in1 (Pt reports having RW and BSC already)  Recommendations for Other Services       Functional Status Assessment Patient has had a recent decline in their functional status and demonstrates the ability to make significant improvements in function in a reasonable and predictable amount of time.     Precautions / Restrictions Precautions Precautions: Knee;Fall Precaution Booklet Issued: Yes (comment) Restrictions Weight Bearing Restrictions Per Provider Order:  Yes LLE Weight Bearing Per Provider Order: Weight bearing as tolerated      Mobility  Bed Mobility Overal bed mobility: Needs Assistance Bed Mobility: Supine to Sit     Supine to sit: Min assist, HOB elevated     General bed mobility comments: assist for L LE; vc's for technique    Transfers Overall transfer level: Needs assistance Equipment used: Rolling walker (2 wheels) Transfers: Sit to/from Stand, Bed to chair/wheelchair/BSC Sit to Stand: Min assist, Contact guard assist   Step pivot transfers: Contact guard assist       General transfer comment: min assist to stand from bed; CGA to stand from recliner; CGA to stand from [facility] over toilet; vc's for UE/LE placement and overall technique; stand step turn bed to recliner with RW CGA    Ambulation/Gait Ambulation/Gait assistance: Contact guard assist Gait Distance (Feet):  (15 feet x2 (to/from bathroom)) Assistive device: Rolling walker (2 wheels) Gait Pattern/deviations: Step-to pattern, Decreased step length - right, Decreased step length - left Gait velocity: decreased     General Gait Details: antalgic; decreased stance time L LE; vc's for walker use and positioning within walker  Stairs            Wheelchair Mobility     Tilt Bed    Modified Rankin (Stroke Patients Only)       Balance Overall balance assessment: Needs assistance Sitting-balance support: No upper extremity supported, Feet supported Sitting balance-Leahy Scale: Good Sitting balance - Comments: steady reaching within BOS   Standing balance support: Bilateral upper extremity supported, During functional activity, Reliant on assistive device for balance Standing balance-Leahy Scale: Good Standing balance comment: steady  ambulating with RW use                             Pertinent Vitals/Pain Pain Assessment Pain Assessment: 0-10 Pain Score: 6  Pain Location: L knee Pain Descriptors / Indicators: Sore, Aching Pain  Intervention(s): Limited activity within patient's tolerance, Monitored during session, Premedicated before session, Repositioned, Other (comment) (polar care applied) Vitals (HR and SpO2 on room air) stable and WFL throughout treatment session.    Home Living Family/patient expects to be discharged to:: Private residence Living Arrangements: Alone Available Help at Discharge: Family;Available PRN/intermittently Type of Home: House Home Access: Stairs to enter Entrance Stairs-Rails: Right;Left (one railing (can either use R or L side of it)) Entrance Stairs-Number of Steps: 3   Home Layout: One level Home Equipment: Shower seat - built [employer] (2 wheels);BSC/3in1 (RW and BSC from her sister who had surgery in the last couple of months) Additional Comments: Son and daughter in law live next door and can assist intermittently as needed.    Prior Function Prior Level of Function : Independent/Modified Independent             Mobility Comments: No recent falls reported ADLs Comments: Independent     Extremity/Trunk Assessment   Upper Extremity Assessment Upper Extremity Assessment: Overall WFL for tasks assessed    Lower Extremity Assessment Lower Extremity Assessment: LLE deficits/detail (R LE WFL) LLE Deficits / Details: min assist to perform L LE SLR; at least 3/5 AROM hip flexion and ankle DF/PF LLE: Unable to fully assess due to pain    Cervical / Trunk Assessment Cervical / Trunk Assessment: Normal  Communication   Communication Communication: No apparent difficulties Cueing Techniques: Verbal cues  Cognition Arousal: Alert Behavior During Therapy: WFL for tasks assessed/performed Overall Cognitive Status: Within Functional Limits for tasks assessed                                          General Comments General comments (skin integrity, edema, etc.): L knee dressing in place.  Nursing cleared pt for participation in physical therapy.   Pt agreeable to PT session.    Exercises Total Joint Exercises Ankle Circles/Pumps: AROM, Strengthening, Both, 10 reps, Supine Quad Sets: AROM, Strengthening, Both, 10 reps, Supine Heel Slides: AAROM, Strengthening, Left, 10 reps, Supine Hip ABduction/ADduction: AAROM, Strengthening, Left, 10 reps, Supine Straight Leg Raises: AAROM, Strengthening, Left, 10 reps, Supine Goniometric ROM: L knee AROM knee extension 5 degrees short of neutral semi-supine in bed and L knee flexion 85 degrees in sitting   Assessment/Plan    PT Assessment Patient needs continued PT services  PT Problem List Decreased strength;Decreased range of motion;Decreased activity tolerance;Decreased balance;Decreased mobility;Decreased knowledge of use of DME;Decreased knowledge of precautions;Decreased skin integrity;Pain       PT Treatment Interventions DME instruction;Gait training;Stair training;Functional mobility training;Therapeutic activities;Therapeutic exercise;Balance training;Patient/family education    PT Goals (Current goals can be found in the Care Plan section)  Acute Rehab PT Goals Patient Stated Goal: to improve pain and walking PT Goal Formulation: With patient Time For Goal Achievement: 09/20/23 Potential to Achieve Goals: Good    Frequency BID     Co-evaluation               AM-PAC PT "6 Clicks" Mobility  Outcome Measure Help needed turning from your back to  your side while in a flat bed without using bedrails?: None Help needed moving from lying on your back to sitting on the side of a flat bed without using bedrails?: A Little Help needed moving to and from a bed to a chair (including a wheelchair)?: A Little Help needed standing up from a chair using your arms (e.g., wheelchair or bedside chair)?: A Little Help needed to walk in hospital room?: A Little Help needed climbing 3-5 steps with a railing? : A Little 6 Click Score: 19    End of Session Equipment Utilized During  Treatment: Gait belt Activity Tolerance: Patient tolerated treatment well Patient left: in chair;with call bell/phone within reach;with SCD's reapplied;Other (comment) (polar care in place; B heels floating via towel rolls) Nurse Communication: Mobility status;Precautions;Weight bearing status;Other (comment) (Pt's pain status) PT Visit Diagnosis: Other abnormalities of gait and mobility (R26.89);Muscle weakness (generalized) (M62.81);Pain Pain - Right/Left: Left Pain - part of body: Knee    Time: 1610-9604 PT Time Calculation (min) (ACUTE ONLY): 30 min   Charges:   PT Evaluation $PT Eval Low Complexity: 1 Low PT Treatments $Gait Training: 8-22 mins $Therapeutic Exercise: 8-22 mins PT General Charges $$ ACUTE PT VISIT: 1 Visit        Hendricks Limes, PT 09/06/23, 5:25 PM

## 2023-09-06 NOTE — Transfer of Care (Signed)
Immediate Anesthesia Transfer of Care Note  Patient: Emily Singleton  Procedure(s) Performed: TOTAL KNEE ARTHROPLASTY (Left: Knee)  Patient Location: PACU  Anesthesia Type:MAC and Spinal  Level of Consciousness: awake and patient cooperative  Airway & Oxygen Therapy: Patient Spontanous Breathing and Patient connected to face mask oxygen  Post-op Assessment: Report given to RN and Patient moving all extremities  Post vital signs: Reviewed and stable  Last Vitals:  Vitals Value Taken Time  BP 123/77 09/06/23 1217  Temp    Pulse 62 09/06/23 1217  Resp 12 09/06/23 1217  SpO2 99 % 09/06/23 1217  Vitals shown include unfiled device data.  Last Pain:  Vitals:   09/06/23 1217  TempSrc:   PainSc: 0-No pain         Complications: No notable events documented.

## 2023-09-06 NOTE — Anesthesia Procedure Notes (Signed)
Procedure Name: MAC Date/Time: 09/06/2023 10:10 AM  Performed by: Lily Lovings, CRNAPre-anesthesia Checklist: Patient identified, Emergency Drugs available, Suction available and Patient being monitored Patient Re-evaluated:Patient Re-evaluated prior to induction Oxygen Delivery Method: Simple face mask Preoxygenation: Pre-oxygenation with 100% oxygen Induction Type: IV induction

## 2023-09-06 NOTE — Op Note (Signed)
Patient Name: Emily Singleton  UUV:[id number]  Pre-Operative Diagnosis: Left knee Osteoarthritis  Post-Operative Diagnosis: (same)  Procedure: Left Total Knee Arthroplasty  Components/Implants: Femur: Persona Size 10 CR   Tibia: Persona Size F w/ 14x87mm stem extension  Poly: 10mm MC  Patella: 35x29mm symmetric  Femoral Valgus Cut Angle: 5 degrees  Distal Femoral Re-cut: none  Patella Resurfacing: yes   Date of Surgery: 09/06/2023  Surgeon: Reinaldo Berber MD  Assistant: Amador Cunas PA (present and scrubbed throughout the case, critical for assistance with exposure, retraction, instrumentation, and closure)   Anesthesiologist: Adams  Anesthesia: Spinal   Tourniquet Time: 59 min  EBL: 50cc  IVF: 900cc  Complications: None   Brief history: The patient is a 72 year old female with a history of osteoarthritis of the left knee with pain limiting their range of motion and activities of daily living, which has failed multiple attempts at conservative therapy.  The risks and benefits of total knee arthroplasty as definitive surgical treatment were discussed with the patient, who opted to proceed with the operation.  After outpatient medical clearance and optimization was completed the patient was admitted to [facility] for the procedure.  All preoperative films were reviewed and an appropriate surgical plan was made prior to surgery. Preoperative range of motion was 0 to 105. The patient was identified as having a varus alignment.   Description of procedure: The patient was brought to the operating room where laterality was confirmed by all those present to be the left side.   Spinal anesthesia was administered and the patient received an intravenous dose of antibiotics for surgical prophylaxis and a dose of tranexamic acid.  Patient is positioned supine on the operating room table with all bony prominences well-padded.  A well-padded tourniquet was applied  to the left thigh.  The knee was then prepped and draped in usual sterile fashion with multiple layers of adhesive and nonadhesive drapes.  All of those present in the operating room participated in a surgical timeout laterality and patient were confirmed.   An Esmarch was wrapped around the extremity and the leg was elevated and the knee flexed.  The tourniquet was inflated to a pressure of 275 mmHg. The Esmarch was removed and the leg was brought down to full extension.  The patella and tibial tubercle identified and outlined using a marking pen and a midline skin incision was made with a knife carried through the subcutaneous tissue down to the extensor retinaculum.  After exposure of the extensor mechanism the medial parapatellar arthrotomy was performed with a scalpel and electrocautery extending down medial and distal to the tibial tubercle taking care to avoid incising the patellar tendon.   A standard medial release was performed over the proximal tibia.  The knee was brought into extension in order to excise the fat pad taking care not to damage the patella tendon.  The superior soft tissue was removed from the anterior surface of the distal femur to visualize for the procedure.  The knee was then brought into flexion with the patella subluxed laterally and subluxing the tibia anteriorly.  The ACL was transected and removed with electrocautery and additional soft tissue was removed from the proximal surface of the tibia to fully expose. The PCL was found to be intact and was preserved.  An extramedullary tibial cutting guide was then applied to the leg with a spring-loaded ankle clamp placed around the distal tibia just above the malleoli the angulation of the guide was adjusted to  give some posterior slope in the tibial resection with an appropriate varus/valgus alignment.  The resection guide was then pinned to the proximal tibia and the proximal tibial surface was resected with an oscillating saw.   Careful attention was paid to ensure the blade did not disrupt any of the soft tissues including any lateral or medial ligament.  Attention was then turned to the femur, with the knee slightly flexed a opening drill was used to enter the medullary canal of the femur.  After removing the drill marrow was suctioned out to decompress the distal femur.  An intramedullary femoral guide was then inserted into the drill hole and the alignment guide was seated firmly against the distal end of the medial femoral condyle.  The distal femoral cutting guide was then attached and pinned securely to the anterior surface of the femur and the intramedullary rod and alignment guide was removed.  Distal femur resection was then performed with an oscillating saw with retractors protecting medial and laterally.   The distal cutting block was then removed and the extension gap was checked with a spacer.  Extension gap was found to be appropriately sized to accommodate the spacer block.   The femoral sizing guide was then placed securely into the posterior condyles of the femur and the femoral size was measured and determined to be 10.  The size 10; 4-in-1 cutting guide was placed in position and secured with 2 pins.  The anterior posterior and chamfer resections were then performed with an oscillating saw.  Bony fragments and osteophytes were then removed.  Using a lamina spreader the posterior medial and lateral condyles were checked for additional osteophytes and posterior soft tissue remnants.  Any remaining meniscus was removed at this time.  Periarticular injection was performed in the meniscal rims and posterior capsule with aspiration performed to ensure no intravascular injection.   The tibia was then exposed and the tibial trial was pinned onto the plateau after confirming appropriate orientation and rotation.  Using the drill bushing the tibia was prepared to the appropriate drill depth.  Tibial broach impactor was  then driven through the punch guide using a mallet.  The femoral trial component was then inserted onto the femur. A trial tibial polyethylene bearing was then placed and the knee was reduced.  The knee achieved full extension with no hyperextension and was found to be balanced in flexion and extension with the trials in place.  The knee was then brought into full extension the patella was everted and held with 2 Kocher clamps.  The articular surface of the patella was then resected with an patella reamer and saw after careful measurement with a caliper.  The patella was then prepared with the drill guide and a trial patella was placed.  The knee was then taken through range of motion and it was found that the patella articulated appropriately with the trochlea and good patellofemoral motion without subluxation.    The correct final components for implantation were confirmed and opened by the circulator nurse.  The prepared surfaces of the patella femur and tibia were cleaned with pulsatile lavage to remove all blood fat and other material and then the surfaces were dried.  2 bags of cement were mixed under vacuum and the components were cemented into place.  Excess cement was removed with curettes and forceps. A trial polyethylene tibial component was placed and the knee was brought into extension to allow the cement to set.  At this time the  periarticular injection cocktail was placed in the soft tissues surrounding the knee.  After full curing of the cement the balance of the knee was checked again and the final polyethylene size was confirmed. The tibial component was irrigated and locking mechanism checked to ensure it was clear of debris. The real polyethylene tibial component was implanted and the knee was brought through a range of motion.   The knee was then irrigated with copious amount of normal saline via pulsatile lavage to remove all loose bodies and other debris.  The knee was then irrigated with  surgiphor betadine based wash and reirrigated with saline.  The tourniquet was then dropped and all bleeding vessels were identified and coagulated.  The arthrotomy was approximated with #1 Vicryl and closed with #1 Stratafix suture.  The knee was brought into slight flexion and the subcutaneous tissues were closed with 0 Vicryl, 2-0 Vicryl and a running subcuticular 4-0 stratafix barbed suture.  Skin was then glued with Dermabond.  A sterile adhesive dressing was then placed along with a sequential compression device to the calf, a Ted stocking, and a cryotherapy cuff.   Sponge, needle, and Lap counts were all correct at the end of the case.   The patient was transferred off of the operating room table to a hospital bed, good pulses were found distally on the operative side.  The patient was transferred to the recovery room in stable condition.

## 2023-09-07 ENCOUNTER — Encounter: Payer: Self-pay | Admitting: Orthopedic Surgery

## 2023-09-07 DIAGNOSIS — M1712 Unilateral primary osteoarthritis, left knee: Secondary | ICD-10-CM | POA: Diagnosis not present

## 2023-09-07 LAB — BASIC METABOLIC PANEL
Anion gap: 8 (ref 5–15)
BUN: 16 mg/dL (ref 8–23)
CO2: 23 mmol/L (ref 22–32)
Calcium: 8.6 mg/dL — ABNORMAL LOW (ref 8.9–10.3)
Chloride: 102 mmol/L (ref 98–111)
Creatinine, Ser: 0.69 mg/dL (ref 0.44–1.00)
GFR, Estimated: >60 mL/min (ref >60)
Glucose, Bld: 111 mg/dL — ABNORMAL HIGH (ref 70–99)
Potassium: 3.9 mmol/L (ref 3.5–5.1)
Sodium: 133 mmol/L — ABNORMAL LOW (ref 135–145)

## 2023-09-07 LAB — CBC
HCT: 37.6 % (ref 36.0–46.0)
Hemoglobin: 12.3 g/dL (ref 12.0–15.0)
MCH: 29.6 pg (ref 26.0–34.0)
MCHC: 32.7 g/dL (ref 30.0–36.0)
MCV: 90.4 fL (ref 80.0–100.0)
Platelets: 176 10*3/uL (ref 150–400)
RBC: 4.16 MIL/uL (ref 3.87–5.11)
RDW: 13.2 % (ref 11.5–15.5)
WBC: 11 10*3/uL — ABNORMAL HIGH (ref 4.0–10.5)
nRBC: 0 % (ref 0.0–0.2)

## 2023-09-07 MED ORDER — ACETAMINOPHEN 500 MG PO TABS: 1000.0000 mg | ORAL_TABLET | Freq: Three times a day (TID) | ORAL | 0 refills | Status: AC

## 2023-09-07 MED ORDER — PANTOPRAZOLE SODIUM 40 MG PO TBEC
DELAYED_RELEASE_TABLET | ORAL | Status: AC
  Filled 2023-09-07: qty 1

## 2023-09-07 MED ORDER — KETOROLAC TROMETHAMINE 15 MG/ML IJ SOLN
INTRAMUSCULAR | Status: AC
  Filled 2023-09-07: qty 1

## 2023-09-07 MED ORDER — ONDANSETRON HCL 4 MG PO TABS: 4.0000 mg | ORAL_TABLET | Freq: Four times a day (QID) | ORAL | 0 refills | Status: DC | PRN

## 2023-09-07 MED ORDER — DOCUSATE SODIUM 100 MG PO CAPS: 100.0000 mg | ORAL_CAPSULE | Freq: Two times a day (BID) | ORAL | 0 refills | Status: DC

## 2023-09-07 MED ORDER — ACETAMINOPHEN 500 MG PO TABS
ORAL_TABLET | ORAL | Status: AC
  Filled 2023-09-07: qty 2

## 2023-09-07 MED ORDER — HYDROCODONE-ACETAMINOPHEN 5-325 MG PO TABS
ORAL_TABLET | ORAL | Status: AC
  Filled 2023-09-07: qty 1

## 2023-09-07 MED ORDER — OXYCODONE HCL 5 MG PO TABS: 2.5000 mg | ORAL_TABLET | Freq: Three times a day (TID) | ORAL | 0 refills | Status: DC | PRN

## 2023-09-07 MED ORDER — TRAMADOL HCL 50 MG PO TABS: 50.0000 mg | ORAL_TABLET | Freq: Four times a day (QID) | ORAL | 0 refills | Status: DC | PRN

## 2023-09-07 MED ORDER — ENOXAPARIN SODIUM 30 MG/0.3ML IJ SOSY
PREFILLED_SYRINGE | INTRAMUSCULAR | Status: AC
  Filled 2023-09-07: qty 0.3

## 2023-09-07 MED ORDER — ENOXAPARIN SODIUM 40 MG/0.4ML IJ SOSY: 40.0000 mg | PREFILLED_SYRINGE | INTRAMUSCULAR | 0 refills | Status: DC

## 2023-09-07 MED ORDER — DOCUSATE SODIUM 100 MG PO CAPS
ORAL_CAPSULE | ORAL | Status: AC
  Filled 2023-09-07: qty 1

## 2023-09-07 NOTE — Discharge Summary (Signed)
Physician Discharge Summary  Patient ID: Emily Singleton MRN: [id number] DOB/AGE: 1950/10/21 72 y.o.  Admit date: 09/06/2023 Discharge date: 09/07/2023  Admission Diagnoses:  S/P TKR (total knee replacement), left [Z96.652]   Discharge Diagnoses: Patient Active Problem List   Diagnosis Date Noted   S/P TKR (total knee replacement), left 09/06/2023   Spondylolisthesis of lumbar region 05/05/2023   Neurogenic claudication due to lumbar spinal stenosis 05/05/2023   S/P lumbar fusion 05/05/2023   GERD (gastroesophageal reflux disease)    DDD (degenerative disc disease), lumbar    IBS (irritable bowel syndrome)     Past Medical History:  Diagnosis Date   Aortic atherosclerosis (HCC)    DDD (degenerative disc disease), lumbar    Depression    Diverticulosis    Endometriosis    Generalized anxiety disorder    GERD (gastroesophageal reflux disease)    possible eosinophilic esophagitis on egd   IBS (irritable bowel syndrome)    Obesity (BMI 35.0-39.9 without comorbidity)    Osteoarthritis    Osteoarthritis of left knee    Osteopenia    Spinal stenosis, lumbar    Vitamin D deficiency      Transfusion: nopne   Consultants (if any):   Discharged Condition: Improved  Hospital Course: Emily Singleton is an 72 y.o. female who was admitted 09/06/2023 with a diagnosis of S/P TKR (total knee replacement), left and went to the operating room on 09/06/2023 and underwent the above named procedures.    Surgeries: Procedure(s): TOTAL KNEE ARTHROPLASTY on 09/06/2023 Patient tolerated the surgery well. Taken to PACU where she was stabilized and then transferred to the orthopedic floor.  Started on Lovenox 30 mg  q 12 hrs. TEDs and SCDs applied bilaterally. Heels elevated on bed. No evidence of DVT. Negative Homan. Physical therapy started on day #1 for gait training and transfer. OT started day #1 for ADL and assisted devices.  Patient's IV was d/c on day #1. Patient was  able to safely and independently complete all PT goals. PT recommending discharge to home.    On post op day #1 patient was stable and ready for discharge to home with HHPT.  Implants: Femur: Persona Size 10 CR   Tibia: Persona Size F w/ 14x29mm stem extension  Poly: 10mm MC  Patella: 35x20mm symmetric   She was given perioperative antibiotics:  Anti-infectives (From admission, onward)    Start     Dose/Rate Route Frequency Ordered Stop   09/06/23 1630  ceFAZolin (ANCEF) IVPB 2g/100 mL premix        2 g 200 mL/hr over 30 Minutes Intravenous Every 6 hours 09/06/23 1417 09/06/23 2225   09/06/23 0600  ceFAZolin (ANCEF) IVPB 2g/100 mL premix        2 g 200 mL/hr over 30 Minutes Intravenous On call to O.R. 09/05/23 2142 09/06/23 1050     .  She was given sequential compression devices, early ambulation, and Lovenox TEDs for DVT prophylaxis.  She benefited maximally from the hospital stay and there were no complications.    Recent vital signs:  Vitals:   09/07/23 0022 09/07/23 0445  BP: 130/88 137/72  Pulse: 75 63  Resp: 16 16  Temp: 98 F (36.7 C) 97.8 F (36.6 C)  SpO2: 95% 95%    Recent laboratory studies:  Lab Results  Component Value Date   HGB 14.5 08/27/2023   HGB 14.1 04/22/2023   HGB 11.9 (L) 04/04/2014   Lab Results  Component Value Date   WBC  7.3 08/27/2023   PLT 217 08/27/2023   Lab Results  Component Value Date   INR 0.99 11/25/2010   Lab Results  Component Value Date   NA 138 08/27/2023   K 3.9 08/27/2023   CL 103 08/27/2023   CO2 24 08/27/2023   BUN 19 08/27/2023   CREATININE 0.72 08/27/2023   GLUCOSE 105 (H) 08/27/2023    Discharge Medications:   Allergies as of 09/07/2023   No Known Allergies      Medication List     TAKE these medications    acetaminophen 500 MG tablet Commonly known as: TYLENOL Take 2 tablets (1,000 mg total) by mouth every 8 (eight) hours.   colestipol 1 g tablet Commonly known as: COLESTID Take 1 g by mouth  2 (two) times daily.   docusate sodium 100 MG capsule Commonly known as: COLACE Take 1 capsule (100 mg total) by mouth 2 (two) times daily.   enoxaparin 40 MG/0.4ML injection Commonly known as: LOVENOX Inject 0.4 mLs (40 mg total) into the skin daily for 14 days.   omeprazole 20 MG capsule Commonly known as: PRILOSEC Take 20 mg by mouth daily as needed (acid reflux).   ondansetron 4 MG tablet Commonly known as: ZOFRAN Take 1 tablet (4 mg total) by mouth every 6 (six) hours as needed for nausea.   oxyCODONE 5 MG immediate release tablet Commonly known as: Roxicodone Take 0.5-1 tablets (2.5-5 mg total) by mouth every 8 (eight) hours as needed for breakthrough pain.   traMADol 50 MG tablet Commonly known as: ULTRAM Take 1 tablet (50 mg total) by mouth every 6 (six) hours as needed for moderate pain (pain score 4-6).               Durable Medical Equipment  (From admission, onward)           Start     Ordered   09/07/23 0732  For home use only DME Walker  Once       Question:  Patient needs a walker to treat with the following condition  Answer:  Total knee replacement status   09/07/23 0731   09/07/23 0732  For home use only DME 3 n 1  Once        09/07/23 0731            Diagnostic Studies: DG Lumbar Spine Complete Result Date: 09/06/2023 CLINICAL DATA:  Possible T12 compression fracture. EXAM: LUMBAR SPINE - COMPLETE 4+ VIEW COMPARISON:  July 13, 2023. FINDINGS: Status post surgical posterior fusion of L3-4 with bilateral intrapedicular screw placement as well as interbody fusion. Stable minimal compression deformity of T12 vertebral body suggesting possible old fracture. No acute fracture or significant spondylolisthesis is noted. No change is noted on flexion or extension views. Mild levoscoliosis of lumbar spine is noted. Mild degenerative disc disease is noted at L1-2 and L2-3. IMPRESSION: Postoperative degenerative changes as described above. No acute  abnormality seen. Electronically Signed   By: Lupita Raider M.D.   On: 09/06/2023 13:12   DG Knee Left Port Result Date: 09/06/2023 CLINICAL DATA:  Status post left total knee replacement. EXAM: PORTABLE LEFT KNEE - 1-2 VIEW COMPARISON:  None Available. FINDINGS: The femoral and tibial components are well situated. Expected postoperative changes are seen in the soft tissues anteriorly. IMPRESSION: Status post left total knee arthroplasty. Electronically Signed   By: Lupita Raider M.D.   On: 09/06/2023 13:09    Disposition:  Follow-up Information     Evon Slack, PA-C Follow up in 2 week(s).   Specialties: Orthopedic Surgery, Emergency Medicine Contact information: [address] [place] [address] [phone]                  Signed: Patience Musca 09/07/2023, 7:35 AM

## 2023-09-07 NOTE — Progress Notes (Signed)
   Subjective: 1 Day Post-Op Procedure(s) (LRB): TOTAL KNEE ARTHROPLASTY (Left) Patient reports pain as mild.   Patient is well, and has had no acute complaints or problems Denies any CP, SOB, ABD pain. We will continue therapy today.  Plan is to go Home after hospital stay.  Objective: Vital signs in last 24 hours: Temp:  [97.4 F (36.3 C)-98 F (36.7 C)] 97.8 F (36.6 C) (12/24 0445) Pulse Rate:  [45-80] 63 (12/24 0445) Resp:  [10-16] 16 (12/24 0445) BP: (112-170)/(63-98) 137/72 (12/24 0445) SpO2:  [95 %-100 %] 95 % (12/24 0445) Weight:  [103.9 kg] 103.9 kg (12/23 0820)  Intake/Output from previous day: 12/23 0701 - 12/24 0700 In: 2655.9 [P.O.:240; I.V.:2015.9; IV Piggyback:400] Out: 950 [Urine:900; Blood:50] Intake/Output this shift: No intake/output data recorded.  No results for input(s): "HGB" in the last 72 hours. No results for input(s): "WBC", "RBC", "HCT", "PLT" in the last 72 hours. No results for input(s): "NA", "K", "CL", "CO2", "BUN", "CREATININE", "GLUCOSE", "CALCIUM" in the last 72 hours. No results for input(s): "LABPT", "INR" in the last 72 hours.  EXAM General - Patient is Alert, Appropriate, and Oriented Extremity - Neurovascular intact Sensation intact distally Intact pulses distally Dorsiflexion/Plantar flexion intact Dressing - dressing C/D/I and no drainage Motor Function - intact, moving foot and toes well on exam.   Past Medical History:  Diagnosis Date   Aortic atherosclerosis (HCC)    DDD (degenerative disc disease), lumbar    Depression    Diverticulosis    Endometriosis    Generalized anxiety disorder    GERD (gastroesophageal reflux disease)    possible eosinophilic esophagitis on egd   IBS (irritable bowel syndrome)    Obesity (BMI 35.0-39.9 without comorbidity)    Osteoarthritis    Osteoarthritis of left knee    Osteopenia    Spinal stenosis, lumbar    Vitamin D deficiency     Assessment/Plan:   1 Day Post-Op Procedure(s)  (LRB): TOTAL KNEE ARTHROPLASTY (Left) Principal Problem:   S/P TKR (total knee replacement), left  Estimated body mass index is 36.96 kg/m as calculated from the following:   Height as of this encounter: 5\' 6"  (1.676 m).   Weight as of this encounter: 103.9 kg. Advance diet Up with therapy Pain well controlled Labs pending VSS Patient doing well with no complaints this am. CM to assist with discharge to home with HHPT  DVT Prophylaxis - Lovenox, TED hose, and SCDS Weight-Bearing as tolerated to left leg   T. Cranston Neighbor, PA-C [facility] 09/07/2023, 7:28 AM

## 2023-09-07 NOTE — Plan of Care (Signed)
  Problem: Clinical Measurements: Goal: Respiratory complications will improve Outcome: Progressing   Problem: Activity: Goal: Risk for activity intolerance will decrease Outcome: Progressing   Problem: Coping: Goal: Level of anxiety will decrease Outcome: Progressing   Problem: Elimination: Goal: Will not experience complications related to urinary retention Outcome: Progressing   Problem: Pain Management: Goal: General experience of comfort will improve Outcome: Progressing   Problem: Activity: Goal: Ability to avoid complications of mobility impairment will improve Outcome: Progressing Goal: Range of joint motion will improve Outcome: Progressing   Problem: Pain Management: Goal: Pain level will decrease with appropriate interventions Outcome: Progressing

## 2023-09-07 NOTE — Plan of Care (Signed)
  Problem: Education: Goal: Knowledge of General Education information will improve Description: Including pain rating scale, medication(s)/side effects and non-pharmacologic comfort measures Outcome: Progressing   Problem: Health Behavior/Discharge Planning: Goal: Ability to manage health-related needs will improve Outcome: Progressing   Problem: Activity: Goal: Risk for activity intolerance will decrease Outcome: Progressing   Problem: Nutrition: Goal: Adequate nutrition will be maintained Outcome: Progressing   Problem: Elimination: Goal: Will not experience complications related to bowel motility Outcome: Progressing   Problem: Education: Goal: Knowledge of the prescribed therapeutic regimen will improve Outcome: Progressing

## 2023-09-07 NOTE — Progress Notes (Signed)
Nsg Discharge Note  Admit Date:  09/06/2023 Discharge date: 09/07/2023   Emily Singleton to be D/C'd Home per MD order.  AVS completed.  Patient/caregiver able to verbalize understanding.  Discharge Medication: Allergies as of 09/07/2023   No Known Allergies      Medication List     TAKE these medications    acetaminophen 500 MG tablet Commonly known as: TYLENOL Take 2 tablets (1,000 mg total) by mouth every 8 (eight) hours.   colestipol 1 g tablet Commonly known as: COLESTID Take 1 g by mouth 2 (two) times daily.   docusate sodium 100 MG capsule Commonly known as: COLACE Take 1 capsule (100 mg total) by mouth 2 (two) times daily.   enoxaparin 40 MG/0.4ML injection Commonly known as: LOVENOX Inject 0.4 mLs (40 mg total) into the skin daily for 14 days.   omeprazole 20 MG capsule Commonly known as: PRILOSEC Take 20 mg by mouth daily as needed (acid reflux).   ondansetron 4 MG tablet Commonly known as: ZOFRAN Take 1 tablet (4 mg total) by mouth every 6 (six) hours as needed for nausea.   oxyCODONE 5 MG immediate release tablet Commonly known as: Roxicodone Take 0.5-1 tablets (2.5-5 mg total) by mouth every 8 (eight) hours as needed for breakthrough pain.   traMADol 50 MG tablet Commonly known as: ULTRAM Take 1 tablet (50 mg total) by mouth every 6 (six) hours as needed for moderate pain (pain score 4-6).               Durable Medical Equipment  (From admission, onward)           Start     Ordered   09/07/23 0732  For home use only DME Walker  Once       Question:  Patient needs a walker to treat with the following condition  Answer:  Total knee replacement status   09/07/23 0731   09/07/23 0732  For home use only DME 3 n 1  Once        09/07/23 0731            Discharge Assessment: Vitals:   09/07/23 0022 09/07/23 0445  BP: 130/88 137/72  Pulse: 75 63  Resp: 16 16  Temp: 98 F (36.7 C) 97.8 F (36.6 C)  SpO2: 95% 95%   Skin  clean, dry and intact without evidence of skin break down, no evidence of skin tears noted. IV catheter discontinued intact. Site without signs and symptoms of complications - no redness or edema noted at insertion site, patient denies c/o pain - only slight tenderness at site.  Dressing with slight pressure applied.  D/c Instructions-Education: Discharge instructions given to patient/family with verbalized understanding. D/c education completed with patient/family including follow up instructions, medication list, d/c activities limitations if indicated, with other d/c instructions as indicated by MD - patient able to verbalize understanding, all questions fully answered. Patient instructed to return to ED, call 911, or call MD for any changes in condition.  Patient escorted via WC, and D/C home via private auto.  Theodore Demark, RN 09/07/2023 10:01 AM

## 2023-09-07 NOTE — Discharge Instructions (Signed)

## 2023-09-07 NOTE — Progress Notes (Signed)
Physical Therapy Treatment Patient Details Name: Emily Singleton MRN: [id number] DOB: [birth year] Today's Date: 09/07/2023   History of Present Illness Pt is a 72 y.o. female s/p L TKA secondary to OA 09/06/23.  PMH includes anxiety, IBS, Vitamin D deficiency, anterior lateral lumbar fusion 05/05/23, claustrophobia.    PT Comments  Patient is making excellent progress with functional independence. Progressed gait distance and completed stair training. Mobility is adequate for discharge home with family support. PT will continue to follow.    If plan is discharge home, recommend the following: A little help with walking and/or transfers;A little help with bathing/dressing/bathroom;Assistance with cooking/housework;Assist for transportation;Help with stairs or ramp for entrance   Can travel by private vehicle        Equipment Recommendations  Rolling walker (2 wheels);BSC/3in1    Recommendations for Other Services       Precautions / Restrictions Precautions Precautions: Knee;Fall Restrictions Weight Bearing Restrictions Per Provider Order: Yes LLE Weight Bearing Per Provider Order: Weight bearing as tolerated     Mobility  Bed Mobility               General bed mobility comments: not assessed as patient sitting up on arrival and post session    Transfers Overall transfer level: Needs assistance Equipment used: Rolling walker (2 wheels) Transfers: Sit to/from Stand Sit to Stand: Supervision           General transfer comment: verbal cues for LLE positioning with sitting for comfort.    Ambulation/Gait Ambulation/Gait assistance: Supervision Gait Distance (Feet): 150 Feet Assistive device: Rolling walker (2 wheels) Gait Pattern/deviations: Step-to pattern, Decreased dorsiflexion - left Gait velocity: decreased     General Gait Details: recommend to continue using rolling walker for support with ambulation. no loss of balance with hallway  ambulation   Stairs Stairs: Yes Stairs assistance: Contact guard assist, Supervision Stair Management: One rail Left, Step to pattern, Forwards Number of Stairs: 4 General stair comments: patient went up 4 steps (rail on left side) and down 4 steps (rail on R side) to simulate home entry/exit. patient demonstrated correct sequencing after initial instruction and demonstration.   Wheelchair Mobility     Tilt Bed    Modified Rankin (Stroke Patients Only)       Balance Overall balance assessment: Needs assistance Sitting-balance support: No upper extremity supported, Feet supported Sitting balance-Leahy Scale: Good     Standing balance support: Bilateral upper extremity supported, During functional activity, Reliant on assistive device for balance Standing balance-Leahy Scale: Good Standing balance comment: using rolling walker for ambulation                            Cognition Arousal: Alert Behavior During Therapy: WFL for tasks assessed/performed Overall Cognitive Status: Within Functional Limits for tasks assessed                                          Exercises Total Joint Exercises Goniometric ROM: L knee flexion 89 degrees    General Comments        Pertinent Vitals/Pain Pain Assessment Pain Assessment: Faces Faces Pain Scale: Hurts a little bit Pain Location: L knee Pain Descriptors / Indicators: Sore Pain Intervention(s): Limited activity within patient's tolerance, Monitored during session, Repositioned    Home Living  Prior Function            PT Goals (current goals can now be found in the care plan section) Acute Rehab PT Goals Patient Stated Goal: to return home and have home health PT for 3 weeks PT Goal Formulation: With patient Time For Goal Achievement: 09/20/23 Potential to Achieve Goals: Good Progress towards PT goals: Progressing toward goals    Frequency     BID      PT Plan      Co-evaluation              AM-PAC PT "6 Clicks" Mobility   Outcome Measure  Help needed turning from your back to your side while in a flat bed without using bedrails?: None Help needed moving from lying on your back to sitting on the side of a flat bed without using bedrails?: A Little Help needed moving to and from a bed to a chair (including a wheelchair)?: A Little Help needed standing up from a chair using your arms (e.g., wheelchair or bedside chair)?: A Little Help needed to walk in hospital room?: A Little Help needed climbing 3-5 steps with a railing? : A Little 6 Click Score: 19    End of Session Equipment Utilized During Treatment: Gait belt Activity Tolerance: Patient tolerated treatment well Patient left: in chair;with call bell/phone within reach Nurse Communication: Mobility status PT Visit Diagnosis: Other abnormalities of gait and mobility (R26.89);Muscle weakness (generalized) (M62.81);Pain Pain - Right/Left: Left Pain - part of body: Knee     Time: 1610-9604 PT Time Calculation (min) (ACUTE ONLY): 19 min  Charges:    $Gait Training: 8-22 mins PT General Charges $$ ACUTE PT VISIT: 1 Visit                     Donna Bernard, PT, MPT    [name] 09/07/2023, 11:32 AM

## 2023-09-07 NOTE — Progress Notes (Signed)
Patient is not able to walk the distance required to go the bathroom, or she is unable to safely negotiate stairs required to access the bathroom.  A 3in1 BSC will alleviate this problem.       T. Chris Rosalena Mccorry, PA-C [facility] 

## 2023-09-14 ENCOUNTER — Encounter: Payer: Self-pay | Admitting: Orthopedic Surgery

## 2023-09-14 NOTE — Anesthesia Postprocedure Evaluation (Signed)
 Anesthesia Post Note  Patient: Emily Singleton  Procedure(s) Performed: TOTAL KNEE ARTHROPLASTY (Left: Knee)  Patient location during evaluation: PACU Anesthesia Type: MAC Level of consciousness: awake and alert Pain management: pain level controlled Vital Signs Assessment: post-procedure vital signs reviewed and stable Respiratory status: spontaneous breathing, nonlabored ventilation, respiratory function stable and patient connected to nasal cannula oxygen Cardiovascular status: blood pressure returned to baseline and stable Postop Assessment: no apparent nausea or vomiting Anesthetic complications: no   No notable events documented.   Last Vitals:  Vitals:   09/07/23 0022 09/07/23 0445  BP: 130/88 137/72  Pulse: 75 63  Resp: 16 16  Temp: 36.7 C 36.6 C  SpO2: 95% 95%    Last Pain:  Vitals:   09/07/23 0809  TempSrc:   PainSc: 2                  Emily Singleton

## 2023-10-25 NOTE — Progress Notes (Signed)
   REFERRING PHYSICIAN:  No referring provider defined for this encounter.  DOS: 05/05/23  L3-L4 XLIF and posterior spinal fusion   HISTORY OF PRESENT ILLNESS:  10/26/23 Emily Singleton is a 73 y.o presenting today for interval follow up due ongoing low back pain. This has been ongoing since surgery, R>L. It is worsened by standing and walking and improves with sitting.  She states she did have some improvement while she was recovering from her recent knee replacement but that since she has started increasing her activity, her pain is worsened.  She describes constant low back pain that she rates about a 3 out of 10 with intermittent increases of her pain to 10 out of 10.  She is currently trying Tylenol which is not giving her any relief.  She is working with physical therapy as a result of her recent knee surgery however they are also doing physical therapy for her back which is not providing her with any relief.  She continues to be without radiating leg symptoms.   08/27/23 Emily Doy Mince PA-C Emily Singleton was last seen by me on 07/13/23 with increased right sided LBP. Radiology report read mild compression deformity at T12. We discussed that her pain was not in this area and she was to continue with PT.   She feels like her back pain is better. She still has some increased right sided LBP that is worse with prolonged standing and walking. She is scheduled for left TKA on Monday.   PHYSICAL EXAMINATION:  General: Patient is well developed, well nourished, calm, collected, and in no apparent distress.   NEUROLOGICAL:  General: In no acute distress.   Awake, alert, oriented to person, place, and time.  Pupils equal round and reactive to light.  Facial tone is symmetric.    Well healed incisions.   Strength:         Side Iliopsoas Quads Hamstring PF DF EHL  R 5 5 5 5 5 5   L 5 5 5 5 5 5    Sensation grossly intact to light touch in bilateral lower extremities.    ROS (Neurologic):   Negative except as noted above  IMAGING: Nothing new to review.   ASSESSMENT/PLAN:  Emily Singleton is s/p lumbar XLIF presenting today for ongoing low back pain that is not managed with conservative treatment.  We discussed the differential diagnosis would include facet irritation, muscle spasm, adjacent level disease, or underlying SI joint irritation.  She does not seem to localize her pain to her SI joints however, and her pain is not particularly reproducible to deep palpation.  I recommended an MRI of her lumbar spine as well as a CT scan to further evaluate things.  She would like to return to clinic in person to review the studies.  She is interested in attempting injections depending on the results of her imaging.  She is to keep her interval 6 month Singleton-op follow up with Dr. Myer Haff with lumbar xrays prior.   I spent a total of 30 minutes in face-to-face and non-face-to-face activities related to this patient's care today including review of outside records, review of imaging, review of symptoms, physical exam, discussion of differential diagnosis, discussion of treatment options, and documentation.   Manning Charity PA-C Department of neurosurgery

## 2023-10-26 ENCOUNTER — Ambulatory Visit (INDEPENDENT_AMBULATORY_CARE_PROVIDER_SITE_OTHER): Payer: Medicare Other | Admitting: Neurosurgery

## 2023-10-26 VITALS — BP 124/78 | Ht 66.0 in | Wt 229.0 lb

## 2023-10-26 DIAGNOSIS — M545 Low back pain, unspecified: Secondary | ICD-10-CM

## 2023-10-26 DIAGNOSIS — Z981 Arthrodesis status: Secondary | ICD-10-CM | POA: Diagnosis not present

## 2023-10-26 MED ORDER — CELECOXIB 200 MG PO CAPS: 200.0000 mg | ORAL_CAPSULE | Freq: Two times a day (BID) | ORAL | 0 refills | Status: DC

## 2023-11-01 ENCOUNTER — Ambulatory Visit
Admission: RE | Admit: 2023-11-01 | Discharge: 2023-11-01 | Disposition: A | Discharge status: Home / Self Care | Payer: Medicare Other | Source: Ambulatory Visit | Attending: Neurosurgery | Admitting: Neurosurgery

## 2023-11-01 ENCOUNTER — Ambulatory Visit
Admission: RE | Admit: 2023-11-01 | Discharge: 2023-11-01 | Disposition: A | Discharge status: Home / Self Care | Payer: Medicare Other | Source: Ambulatory Visit | Attending: Neurosurgery

## 2023-11-01 DIAGNOSIS — Z981 Arthrodesis status: Secondary | ICD-10-CM | POA: Diagnosis present

## 2023-11-01 DIAGNOSIS — M545 Low back pain, unspecified: Secondary | ICD-10-CM

## 2023-11-04 ENCOUNTER — Ambulatory Visit: Admission: RE | Admit: 2023-11-04 | Payer: Medicare Other | Source: Ambulatory Visit

## 2023-11-04 ENCOUNTER — Ambulatory Visit: Payer: Medicare Other

## 2023-11-25 ENCOUNTER — Ambulatory Visit: Admitting: Neurosurgery

## 2023-11-25 VITALS — BP 126/82 | Ht 66.0 in | Wt 229.0 lb

## 2023-11-25 DIAGNOSIS — M5416 Radiculopathy, lumbar region: Secondary | ICD-10-CM

## 2023-11-25 DIAGNOSIS — M545 Low back pain, unspecified: Secondary | ICD-10-CM | POA: Diagnosis not present

## 2023-11-25 NOTE — Progress Notes (Signed)
 REFERRING PHYSICIAN:  Marisue Ivan, Md [address] [place],  [place] [address]  DOS: 05/05/23  L3-L4 XLIF and posterior spinal fusion   HISTORY OF PRESENT ILLNESS:  11/25/23 Emily Singleton presents today for review of her lumbar MRI and CT scans. She continues to have right sided low back pain but is now also having pain into her right groin intermittently   10/26/23 Emily Singleton is a 73 y.o presenting today for interval follow up due ongoing low back pain. This has been ongoing since surgery, R>L. It is worsened by standing and walking and improves with sitting.  She states she did have some improvement while she was recovering from her recent knee replacement but that since she has started increasing her activity, her pain is worsened.  She describes constant low back pain that she rates about a 3 out of 10 with intermittent increases of her pain to 10 out of 10.  She is currently trying Tylenol which is not giving her any relief.  She is working with physical therapy as a result of her recent knee surgery however they are also doing physical therapy for her back which is not providing her with any relief.  She continues to be without radiating leg symptoms.   08/27/23 Emily Doy Mince PA-C Whitney Post was last seen by me on 07/13/23 with increased right sided LBP. Radiology report read mild compression deformity at T12. We discussed that her pain was not in this area and she was to continue with PT.   She feels like her back pain is better. She still has some increased right sided LBP that is worse with prolonged standing and walking. She is scheduled for left TKA on Monday.   PHYSICAL EXAMINATION:  General: Patient is well developed, well nourished, calm, collected, and in no apparent distress.   NEUROLOGICAL:  General: In no acute distress.   Awake, alert, oriented to person, place, and time.  Pupils equal round and reactive to light.  Facial tone is  symmetric.    Well healed incisions.   Strength:         Side Iliopsoas Quads Hamstring PF DF EHL  R 5 5 5 5 5 5   L 5 5 5 5 5 5    Sensation grossly intact to light touch in bilateral lower extremities.    ROS (Neurologic):  Negative except as noted above  IMAGING: 11/01/23 CT L spine FINDINGS: Segmentation: There are five lumbar type vertebral bodies. The last full intervertebral disc space is labeled L5-S1. This correlates with the MRI examinations.   Alignment: Stable degenerative anterolisthesis of L3 and L4 due to advanced facet disease.   Vertebrae: No fracture or bone lesions. No pars defects. Some artifact associated with the L3-4 fusion hardware. No complicating features associated with the fusion. No loosening or hardware fracture. No definite solid interbody fusion changes.   Paraspinal and other soft tissues: No significant paraspinal or retroperitoneal findings. Moderate aortic calcifications but no aneurysm.   Disc levels: Moderate-sized broad-based right paracentral and medial right foraminal disc protrusion at L1-2, better evaluated on the MRI. This likely affects both the right L1 and L2 nerve roots.   Multifactorial spinal and bilateral lateral recess stenosis at L2-3.   No complicating features associated with the hardware at L3-4. No spinal or foraminal stenosis.   Bulging degenerated uncovered disc at L4-5 along with severe facet disease contributing to significant spinal and bilateral lateral recess stenosis.   No significant  findings at L5-S1.   IMPRESSION: 1. Moderate-sized broad-based right paracentral and medial right foraminal disc protrusion at L1-2 better evaluated on the MRI. This likely affects both the right L1 and L2 nerve roots. 2. Multifactorial spinal and bilateral lateral recess stenosis at L2-3. 3. Bulging degenerated uncovered disc at L4-5 along with severe facet disease contributing to significant spinal and  bilateral lateral recess stenosis. 4. No complicating features associated with the L3-4 fusion hardware. No loosening or hardware fracture. No definite solid interbody fusion changes. 5. Aortic atherosclerosis.     Electronically Signed   By: Rudie Meyer M.D.   On: 11/18/2023 17:48  11/01/23 MRI L spine FINDINGS: Segmentation: There are five lumbar type vertebral bodies. The last full intervertebral disc space is labeled L5-S1. This correlates with the prior MRI examination.   Alignment:  Stable mild degenerative anterolisthesis of L3 and L4.   Vertebrae: New lumbar fusion hardware at L3-4 with pedicle screws, posterior rods and interbody fusion device. No obvious complicating features. No marrow edema or bone lesions.   Conus medullaris and cauda equina: Conus extends to the L1 level. Conus and cauda equina appear normal.   Paraspinal and other soft tissues: No significant paraspinal or retroperitoneal findings.   Disc levels:   T12-L1: No significant findings.  Stable mild annular bulge.   L1-2: Moderate-sized right paracentral and right foraminal disc protrusion progressive since the prior MRI. There is moderate mass effect on the right side of the thecal sac and significant right lateral recess stenosis likely affecting the right L2 nerve root. There is also right foraminal narrowing potentially affecting the right L1 nerve root.   L2-3: Stable diffuse bulging annulus, osteophytic ridging and facet disease along with ligamentum flavum thickening all contributing to moderate spinal and bilateral lateral recess stenosis. No significant foraminal stenosis.   L3-4: Posterior and interbody fusion changes with decompressive laminectomy. No spinal or foraminal stenosis. Moderate facet disease.   L4-5: Bulging uncovered disc, advanced facet disease and mild ligamentum flavum thickening contributing to moderately severe spinal and bilateral lateral recess stenosis  progressive since the prior study. No significant foraminal stenosis.   L5-S1: Facet disease but no disc protrusions, spinal or foraminal stenosis.   IMPRESSION: 1. New lumbar fusion hardware at L3-4 with pedicle screws, posterior rods and interbody fusion device. No obvious complicating features. 2. Moderate-sized right paracentral and right foraminal disc protrusion at L1-2 progressive since the prior MRI. There is moderate mass effect on the right side of the thecal sac and significant right lateral recess stenosis likely affecting the right L2 nerve root. There is also right foraminal narrowing potentially affecting the right L1 nerve root. 3. Stable moderate spinal and bilateral lateral recess stenosis at L2-3. 4. Moderate to moderately severe multifactorial spinal and bilateral lateral recess stenosis at L4-5 progressive since the prior study.     Electronically Signed   By: Rudie Meyer M.D.   On: 11/18/2023 17:40  ASSESSMENT/PLAN:  Emily Singleton is s/p lumbar XLIF presenting today for ongoing low back pain that is not managed with conservative treatment.  Her MRI shows a worsening right paracentral disc protrusion that is causing right L2 nerve compression.  We discussed her CT and MRI results in detail and I recommended attempting cervical ESI's.  I have placed a referral to Dr. Yves Dill for this. We discussed that if these do not work it may be worthwhile to consider our medial branch blocks and RFAs at L1-2 and L2-3.  I will see  her back in 4 to 6 weeks via telephone visit to evaluate her progress with injections.  She was encouraged to call the office in the interim with any questions or concerns.  I spent a total of 30 minutes in face-to-face and non-face-to-face activities related to this patient's care today including review of review of imaging, review of symptoms, physical exam, discussion of differential diagnosis, discussion of treatment options, and  documentation.   Manning Charity PA-C Department of neurosurgery

## 2023-12-07 ENCOUNTER — Ambulatory Visit: Admitting: Neurosurgery

## 2023-12-28 ENCOUNTER — Ambulatory Visit (INDEPENDENT_AMBULATORY_CARE_PROVIDER_SITE_OTHER): Admitting: Neurosurgery

## 2023-12-28 DIAGNOSIS — M47816 Spondylosis without myelopathy or radiculopathy, lumbar region: Secondary | ICD-10-CM

## 2023-12-28 DIAGNOSIS — R1031 Right lower quadrant pain: Secondary | ICD-10-CM

## 2023-12-28 DIAGNOSIS — M545 Low back pain, unspecified: Secondary | ICD-10-CM | POA: Diagnosis not present

## 2023-12-28 DIAGNOSIS — Z981 Arthrodesis status: Secondary | ICD-10-CM

## 2023-12-28 NOTE — Progress Notes (Signed)
 Neurosurgery Telephone (Audio-Only) Note  Requesting Provider     Marisue Ivan, MD [phone] [facility] [address] [facility] [place],  [place] [address] T: [phone] F: [phone]  Primary Care Provider Marisue Ivan, MD [address] [facility] [address] [facility] [place] [address] T: [phone] F: [phone]  Telehealth visit was conducted with Emily Singleton, a 73 y.o. female via telephone.  History of Present Illness: Emily Singleton is a 73 y.o presenting today via telephone to review her response to resent injections. She underwent right L1-2 and L2-3 TFESI with Dr. Yves Dill on 12/03/23.  She states that the injection may have helped for a day or 2 but very minimally.  Her symptoms are still significant and affecting her quality of life.  11/25/23 Emily Singleton presents today for review of her lumbar MRI and CT scans. She continues to have right sided low back pain but is now also having pain into her right groin intermittently    10/26/23 Emily Singleton is a 73 y.o presenting today for interval follow up due ongoing low back pain. This has been ongoing since surgery, R>L. It is worsened by standing and walking and improves with sitting.  She states she did have some improvement while she was recovering from her recent knee replacement but that since she has started increasing her activity, her pain is worsened.  She describes constant low back pain that she rates about a 3 out of 10 with intermittent increases of her pain to 10 out of 10.  She is currently trying Tylenol which is not giving her any relief.  She is working with physical therapy as a result of her recent knee surgery however they are also doing physical therapy for her back which is not providing her with any relief.  She continues to be without radiating leg symptoms.     08/27/23 Emily Singleton Emily Singleton was last seen by me on 07/13/23 with increased right sided LBP. Radiology report  read mild compression deformity at T12. We discussed that her pain was not in this area and she was to continue with PT.    She feels like her back pain is better. She still has some increased right sided LBP that is worse with prolonged standing and walking. She is scheduled for left TKA on Monday.   General Review of Systems:  A ROS was performed including pertinent positive and negatives as documented.  All other systems are negative.   Prior to Admission medications   Medication Sig Start Date End Date Taking? Authorizing Provider  acetaminophen (TYLENOL) 500 MG tablet Take 2 tablets (1,000 mg total) by mouth every 8 (eight) hours. 09/07/23   Evon Slack, Singleton  omeprazole (PRILOSEC) 20 MG capsule Take 20 mg by mouth daily as needed (acid reflux).    [provider]    DATA REVIEWED    Imaging Studies  11/01/23 CT lumbar spine IMPRESSION: 1. Moderate-sized broad-based right paracentral and medial right foraminal disc protrusion at L1-2 better evaluated on the MRI. This likely affects both the right L1 and L2 nerve roots. 2. Multifactorial spinal and bilateral lateral recess stenosis at L2-3. 3. Bulging degenerated uncovered disc at L4-5 along with severe facet disease contributing to significant spinal and bilateral lateral recess stenosis. 4. No complicating features associated with the L3-4 fusion hardware. No loosening or hardware fracture. No definite solid interbody fusion changes. 5. Aortic atherosclerosis.     Electronically Signed   By: Orlene Plum.D.  On: 11/18/2023 17:48   11/01/23 MRI L spine IMPRESSION: 1. New lumbar fusion hardware at L3-4 with pedicle screws, posterior rods and interbody fusion device. No obvious complicating features. 2. Moderate-sized right paracentral and right foraminal disc protrusion at L1-2 progressive since the prior MRI. There is moderate mass effect on the right side of the thecal sac and significant right  lateral recess stenosis likely affecting the right L2 nerve root. There is also right foraminal narrowing potentially affecting the right L1 nerve root. 3. Stable moderate spinal and bilateral lateral recess stenosis at L2-3. 4. Moderate to moderately severe multifactorial spinal and bilateral lateral recess stenosis at L4-5 progressive since the prior study.     Electronically Signed   By: Marrian Siva M.D.   On: 11/18/2023 17:40   IMPRESSION  Emily Singleton is a 73 y.o. female who I performed a telephone encounter today for evaluation and management of ongoing back and right groin pain with worsening spondylotic changes at L1-2 and L2-3.  PLAN  Emily Singleton is a very pleasant 73 y.o presenting today via telephone visit to discuss her results of her recent injection.  It sounds like her response was minimal.  I recommended following up with Dr. Erman Hayward to discuss consideration of L1-2 and L2-3 MBB vs SI joint injection. We previously discussed that if she has a good response to MBB but her symptoms return that it might be reasonable to consider additional surgery but that this would be done as a last resort. I will follow up with her in 8 weeks to discuss her response and further plan of care. She is welcome to cancel this appointment with me should she improve significantly but I recommended that she keep her regularly scheduled follow up with Dr. Mont Antis  No orders of the defined types were placed in this encounter.   DISPOSITION  Follow up: In person appointment in 2 months  Kelsi Benham Vonne Guarneri, PA Neurosurgery   TELEPHONE DOCUMENTATION   This visit was performed via telephone.  Patient location: home Provider location: office  I spent a total of 12 minutes non-face-to-face activities for this visit on the date of this encounter including review of current clinical condition and response to treatment.  The patient is aware of and accepts the limits of this telehealth  visit.

## 2024-02-01 ENCOUNTER — Encounter: Payer: Self-pay | Admitting: Family Medicine

## 2024-02-01 DIAGNOSIS — Z1231 Encounter for screening mammogram for malignant neoplasm of breast: Secondary | ICD-10-CM

## 2024-02-03 ENCOUNTER — Other Ambulatory Visit: Payer: Self-pay | Admitting: Family Medicine

## 2024-02-03 DIAGNOSIS — Z1231 Encounter for screening mammogram for malignant neoplasm of breast: Secondary | ICD-10-CM

## 2024-02-22 ENCOUNTER — Ambulatory Visit: Admitting: Neurosurgery

## 2024-02-24 ENCOUNTER — Ambulatory Visit
Admission: RE | Admit: 2024-02-24 | Discharge: 2024-02-24 | Disposition: A | Discharge status: Home / Self Care | Source: Ambulatory Visit | Attending: Family Medicine | Admitting: Family Medicine

## 2024-02-24 DIAGNOSIS — Z1231 Encounter for screening mammogram for malignant neoplasm of breast: Secondary | ICD-10-CM | POA: Insufficient documentation

## 2024-02-25 ENCOUNTER — Other Ambulatory Visit: Payer: Self-pay

## 2024-02-25 DIAGNOSIS — M4316 Spondylolisthesis, lumbar region: Secondary | ICD-10-CM

## 2024-02-28 ENCOUNTER — Ambulatory Visit
Admission: RE | Admit: 2024-02-28 | Discharge: 2024-02-28 | Disposition: A | Discharge status: Home / Self Care | Attending: Neurosurgery | Admitting: Neurosurgery

## 2024-02-28 ENCOUNTER — Ambulatory Visit
Admission: RE | Admit: 2024-02-28 | Discharge: 2024-02-28 | Disposition: A | Discharge status: Home / Self Care | Source: Ambulatory Visit | Attending: Neurosurgery | Admitting: Neurosurgery

## 2024-02-28 DIAGNOSIS — M4316 Spondylolisthesis, lumbar region: Secondary | ICD-10-CM | POA: Diagnosis present

## 2024-02-29 ENCOUNTER — Encounter: Payer: Self-pay | Admitting: Neurosurgery

## 2024-02-29 ENCOUNTER — Ambulatory Visit (INDEPENDENT_AMBULATORY_CARE_PROVIDER_SITE_OTHER): Payer: Medicare Other | Admitting: Neurosurgery

## 2024-02-29 VITALS — BP 134/82 | Ht 66.0 in | Wt 229.0 lb

## 2024-02-29 DIAGNOSIS — M5416 Radiculopathy, lumbar region: Secondary | ICD-10-CM | POA: Diagnosis not present

## 2024-02-29 DIAGNOSIS — Z981 Arthrodesis status: Secondary | ICD-10-CM

## 2024-02-29 DIAGNOSIS — M4316 Spondylolisthesis, lumbar region: Secondary | ICD-10-CM

## 2024-02-29 DIAGNOSIS — M51369 Other intervertebral disc degeneration, lumbar region without mention of lumbar back pain or lower extremity pain: Secondary | ICD-10-CM | POA: Diagnosis not present

## 2024-02-29 NOTE — Progress Notes (Signed)
 REFERRING PHYSICIAN:  Monique Ano, Md [address] [place],  [place] [address]  DOS: 05/05/23  L3-L4 XLIF and posterior spinal fusion   HISTORY OF PRESENT ILLNESS: Emily Singleton is status post  L3-L4 XLIF and posterior spinal fusion.  Previously, she was doing very well.  She comes in today with right pain around her hip extending into her groin.  She tried prednisone which helped substantially.  This pain is different than she had prior to surgery.    Procedures: 12/03/2023: Right L1-2 and right L2-3 transforaminal ESI (1-2 days of relief, dexamethasone  13 mg)  10/14/2022: Right L4-5 and right S1 transforaminal ESI (nearly complete relief x 3 weeks, then sustained 50% relief, dexamethasone  13 mg) 09/10/2022: Right L4-5 and right S1 transforaminal ESI (good relief x 5 days, then 30-40% relief) 07/07/2022: Right hip joint injection (resolution of groin pain) 05/19/2022: RFA to the right L3-4 and L4-5 facet joints (moderate to good relief) 04/30/2022: MBB to the right L3-4 and L4-5 facet joints (9/10 to 0/10) 04/16/2022: MBB to the right L3-4 and L4-5 facet joints (9/10 to 0/10) 11/18/2021: Right L4-5 and right S1 transforaminal ESI (moderate to good relief) 06/27/2021: Right L4-5 and right S1 transforaminal ESI (moderate to good relief) 04/10/2021: Left C4 trigger point injection (good relief) 10/14/2020: Left C4 trigger point injection (good relief) 05/13/2020: Right L4-5 and right S1 transforaminal ESI (moderate relief) 04/03/2020: Right L4-5 and right L5-S1 transforaminal ESI (good relief of right posterior thigh and posterior calf pain, minimal improvement of right buttock pain) 02/02/2020: Right sacroiliac joint injection (mild to moderate relief) 02/18/2017: Right sacroiliac joint injection (good relief) 05/05/2016: Right sacroiliac joint injection (good relief)   PHYSICAL EXAMINATION:  General: Patient is well developed, well nourished,  calm, collected, and in no apparent distress.   NEUROLOGICAL:  General: In no acute distress.   Awake, alert, oriented to person, place, and time.  Pupils equal round and reactive to light.  Facial tone is symmetric.     Strength:            Side Iliopsoas Quads Hamstring PF DF EHL  R 5 5 5 5 5 5   L 5 5 5 5 5 5    Incisions c/d/i    ROS (Neurologic):  Negative except as noted above  IMAGING: No complications noted on xrays  MRI L spine 11/01/2023 IMPRESSION: 1. New lumbar fusion hardware at L3-4 with pedicle screws, posterior rods and interbody fusion device. No obvious complicating features. 2. Moderate-sized right paracentral and right foraminal disc protrusion at L1-2 progressive since the prior MRI. There is moderate mass effect on the right side of the thecal sac and significant right lateral recess stenosis likely affecting the right L2 nerve root. There is also right foraminal narrowing potentially affecting the right L1 nerve root. 3. Stable moderate spinal and bilateral lateral recess stenosis at L2-3. 4. Moderate to moderately severe multifactorial spinal and bilateral lateral recess stenosis at L4-5 progressive since the prior study.     Electronically Signed   By: Marrian Siva M.D.   On: 11/18/2023 17:40  ASSESSMENT/PLAN:  Emily Singleton is doing well s/p above surgery.  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  She has tried an epidural steroid injection but did not have persistent relief.  I suggested trying physical therapy, as the next surgical step would be significant.  I will see her back in 2 months.  I am  hopeful that she will improve.  I spent a total of 15 minutes in this patient's care today. This time was spent reviewing pertinent records including imaging studies, obtaining and confirming history, performing a directed evaluation, formulating and discussing my recommendations, and  documenting the visit within the medical record.     Jodeen Munch Department of neurosurgery

## 2024-05-02 ENCOUNTER — Ambulatory Visit (INDEPENDENT_AMBULATORY_CARE_PROVIDER_SITE_OTHER): Admitting: Neurosurgery

## 2024-05-02 ENCOUNTER — Encounter: Payer: Self-pay | Admitting: Neurosurgery

## 2024-05-02 VITALS — BP 126/84 | Ht 66.0 in | Wt 229.0 lb

## 2024-05-02 DIAGNOSIS — Z981 Arthrodesis status: Secondary | ICD-10-CM | POA: Diagnosis not present

## 2024-05-02 DIAGNOSIS — M51369 Other intervertebral disc degeneration, lumbar region without mention of lumbar back pain or lower extremity pain: Secondary | ICD-10-CM | POA: Diagnosis not present

## 2024-05-02 NOTE — Progress Notes (Signed)
 REFERRING PHYSICIAN:  Alla Amis, Md [address] [place],  [place] [address]  DOS: 05/05/23  L3-L4 XLIF and posterior spinal fusion   HISTORY OF PRESENT ILLNESS: 05/02/2024 Emily Singleton is doing somewhat better than last time.  She has had improvement with steroids and is now taking tramadol  1 pill/day as well as Tylenol .  This is helping her significantly.    02/29/2024 Emily Singleton is status post  L3-L4 XLIF and posterior spinal fusion.  Previously, she was doing very well.  She comes in today with right pain around her hip extending into her groin.  She tried prednisone which helped substantially.  This pain is different than she had prior to surgery.    Procedures: 12/03/2023: Right L1-2 and right L2-3 transforaminal ESI (1-2 days of relief, dexamethasone  13 mg)  10/14/2022: Right L4-5 and right S1 transforaminal ESI (nearly complete relief x 3 weeks, then sustained 50% relief, dexamethasone  13 mg) 09/10/2022: Right L4-5 and right S1 transforaminal ESI (good relief x 5 days, then 30-40% relief) 07/07/2022: Right hip joint injection (resolution of groin pain) 05/19/2022: RFA to the right L3-4 and L4-5 facet joints (moderate to good relief) 04/30/2022: MBB to the right L3-4 and L4-5 facet joints (9/10 to 0/10) 04/16/2022: MBB to the right L3-4 and L4-5 facet joints (9/10 to 0/10) 11/18/2021: Right L4-5 and right S1 transforaminal ESI (moderate to good relief) 06/27/2021: Right L4-5 and right S1 transforaminal ESI (moderate to good relief) 04/10/2021: Left C4 trigger point injection (good relief) 10/14/2020: Left C4 trigger point injection (good relief) 05/13/2020: Right L4-5 and right S1 transforaminal ESI (moderate relief) 04/03/2020: Right L4-5 and right L5-S1 transforaminal ESI (good relief of right posterior thigh and posterior calf pain, minimal improvement of right buttock pain) 02/02/2020: Right sacroiliac joint injection (mild to  moderate relief) 02/18/2017: Right sacroiliac joint injection (good relief) 05/05/2016: Right sacroiliac joint injection (good relief)   PHYSICAL EXAMINATION:  General: Patient is well developed, well nourished, calm, collected, and in no apparent distress.   NEUROLOGICAL:  General: In no acute distress.   Awake, alert, oriented to person, place, and time.  Pupils equal round and reactive to light.  Facial tone is symmetric.     Strength:            Side Iliopsoas Quads Hamstring PF DF EHL  R 5 5 5 5 5 5   L 5 5 5 5 5 5    Incisions c/d/i    ROS (Neurologic):  Negative except as noted above  IMAGING: No complications noted on xrays  MRI L spine 11/01/2023 IMPRESSION: 1. New lumbar fusion hardware at L3-4 with pedicle screws, posterior rods and interbody fusion device. No obvious complicating features. 2. Moderate-sized right paracentral and right foraminal disc protrusion at L1-2 progressive since the prior MRI. There is moderate mass effect on the right side of the thecal sac and significant right lateral recess stenosis likely affecting the right L2 nerve root. There is also right foraminal narrowing potentially affecting the right L1 nerve root. 3. Stable moderate spinal and bilateral lateral recess stenosis at L2-3. 4. Moderate to moderately severe multifactorial spinal and bilateral lateral recess stenosis at L4-5 progressive since the prior study.     Electronically Signed   By: MYRTIS Stammer M.D.   On: 11/18/2023 17:40  ASSESSMENT/PLAN:  [name] is doing better.    She is happy with her current level symptoms.  I would like to see her back in approximately  3 months.   I spent a total of 15 minutes in this patient's care today. This time was spent reviewing pertinent records including imaging studies, obtaining and confirming history, performing a directed evaluation, formulating and discussing my recommendations, and documenting the visit within  the medical record.     Reeves Daisy Department of neurosurgery

## 2024-08-01 ENCOUNTER — Ambulatory Visit: Admitting: Neurosurgery

## 2024-08-23 ENCOUNTER — Ambulatory Visit: Payer: Medicare Other | Admitting: Dermatology

## 2024-10-20 ENCOUNTER — Encounter
